# Patient Record
Sex: Female | Born: 1935 | Race: White | Hispanic: No | Marital: Married | Smoking: Never smoker
Health system: Southern US, Community
[De-identification: ages and names within clinical notes are randomized; demographics above are authoritative.]

## PROBLEM LIST (undated history)

## (undated) DIAGNOSIS — R011 Cardiac murmur, unspecified: Secondary | ICD-10-CM

## (undated) DIAGNOSIS — K219 Gastro-esophageal reflux disease without esophagitis: Secondary | ICD-10-CM

## (undated) DIAGNOSIS — R51 Headache: Secondary | ICD-10-CM

## (undated) DIAGNOSIS — R519 Headache, unspecified: Secondary | ICD-10-CM

## (undated) HISTORY — PX: EYE SURGERY: SHX253

## (undated) HISTORY — PX: ABDOMINAL HYSTERECTOMY: SHX81

---

## 1998-08-01 ENCOUNTER — Other Ambulatory Visit: Admission: RE | Admit: 1998-08-01 | Discharge: 1998-08-01 | Payer: Self-pay | Admitting: Gynecology

## 1999-08-06 ENCOUNTER — Other Ambulatory Visit: Admission: RE | Admit: 1999-08-06 | Discharge: 1999-08-06 | Payer: Self-pay | Admitting: Gynecology

## 1999-08-21 ENCOUNTER — Encounter: Admission: RE | Admit: 1999-08-21 | Discharge: 1999-11-19 | Payer: Self-pay | Admitting: Family Medicine

## 2000-08-11 ENCOUNTER — Other Ambulatory Visit: Admission: RE | Admit: 2000-08-11 | Discharge: 2000-08-11 | Payer: Self-pay | Admitting: Gynecology

## 2001-08-30 ENCOUNTER — Other Ambulatory Visit: Admission: RE | Admit: 2001-08-30 | Discharge: 2001-08-30 | Payer: Self-pay | Admitting: Gynecology

## 2002-08-23 ENCOUNTER — Encounter: Payer: Self-pay | Admitting: Family Medicine

## 2002-08-23 ENCOUNTER — Encounter: Admission: RE | Admit: 2002-08-23 | Discharge: 2002-08-23 | Payer: Self-pay | Admitting: Family Medicine

## 2002-09-05 ENCOUNTER — Other Ambulatory Visit: Admission: RE | Admit: 2002-09-05 | Discharge: 2002-09-05 | Payer: Self-pay | Admitting: Gynecology

## 2003-09-12 ENCOUNTER — Other Ambulatory Visit: Admission: RE | Admit: 2003-09-12 | Discharge: 2003-09-12 | Payer: Self-pay | Admitting: Gynecology

## 2004-09-09 ENCOUNTER — Other Ambulatory Visit: Admission: RE | Admit: 2004-09-09 | Discharge: 2004-09-09 | Payer: Self-pay | Admitting: Gynecology

## 2005-09-24 ENCOUNTER — Other Ambulatory Visit: Admission: RE | Admit: 2005-09-24 | Discharge: 2005-09-24 | Payer: Self-pay | Admitting: Gynecology

## 2005-12-01 ENCOUNTER — Encounter: Admission: RE | Admit: 2005-12-01 | Discharge: 2005-12-01 | Payer: Self-pay | Admitting: Family Medicine

## 2006-09-30 ENCOUNTER — Other Ambulatory Visit: Admission: RE | Admit: 2006-09-30 | Discharge: 2006-09-30 | Payer: Self-pay | Admitting: Gynecology

## 2006-12-15 ENCOUNTER — Encounter: Admission: RE | Admit: 2006-12-15 | Discharge: 2006-12-15 | Payer: Self-pay | Admitting: Family Medicine

## 2006-12-24 ENCOUNTER — Encounter: Admission: RE | Admit: 2006-12-24 | Discharge: 2006-12-24 | Payer: Self-pay | Admitting: Family Medicine

## 2007-10-04 ENCOUNTER — Other Ambulatory Visit: Admission: RE | Admit: 2007-10-04 | Discharge: 2007-10-04 | Payer: Self-pay | Admitting: Gynecology

## 2008-02-07 ENCOUNTER — Encounter: Admission: RE | Admit: 2008-02-07 | Discharge: 2008-02-07 | Payer: Self-pay | Admitting: Family Medicine

## 2008-02-10 ENCOUNTER — Encounter: Admission: RE | Admit: 2008-02-10 | Discharge: 2008-02-10 | Payer: Self-pay | Admitting: Family Medicine

## 2008-03-17 ENCOUNTER — Encounter: Admission: RE | Admit: 2008-03-17 | Discharge: 2008-04-19 | Payer: Self-pay | Admitting: Orthopedic Surgery

## 2008-10-23 ENCOUNTER — Emergency Department (HOSPITAL_BASED_OUTPATIENT_CLINIC_OR_DEPARTMENT_OTHER): Admission: EM | Admit: 2008-10-23 | Discharge: 2008-10-23 | Payer: Self-pay | Admitting: Emergency Medicine

## 2008-10-23 ENCOUNTER — Ambulatory Visit: Payer: Self-pay | Admitting: Radiology

## 2009-04-27 ENCOUNTER — Encounter: Admission: RE | Admit: 2009-04-27 | Discharge: 2009-04-27 | Payer: Self-pay | Admitting: Family Medicine

## 2009-10-12 ENCOUNTER — Other Ambulatory Visit: Admission: RE | Admit: 2009-10-12 | Discharge: 2009-10-12 | Payer: Self-pay | Admitting: Family Medicine

## 2009-11-15 IMAGING — CR DG CHEST 2V
2 series · 2 of 2 positions shown · non-contrast
Comparison: 12/15/2006.

CLINICAL DATA: Left-sided pain.  Some shortness breath.

CHEST - 2 VIEW

[w chest pa]
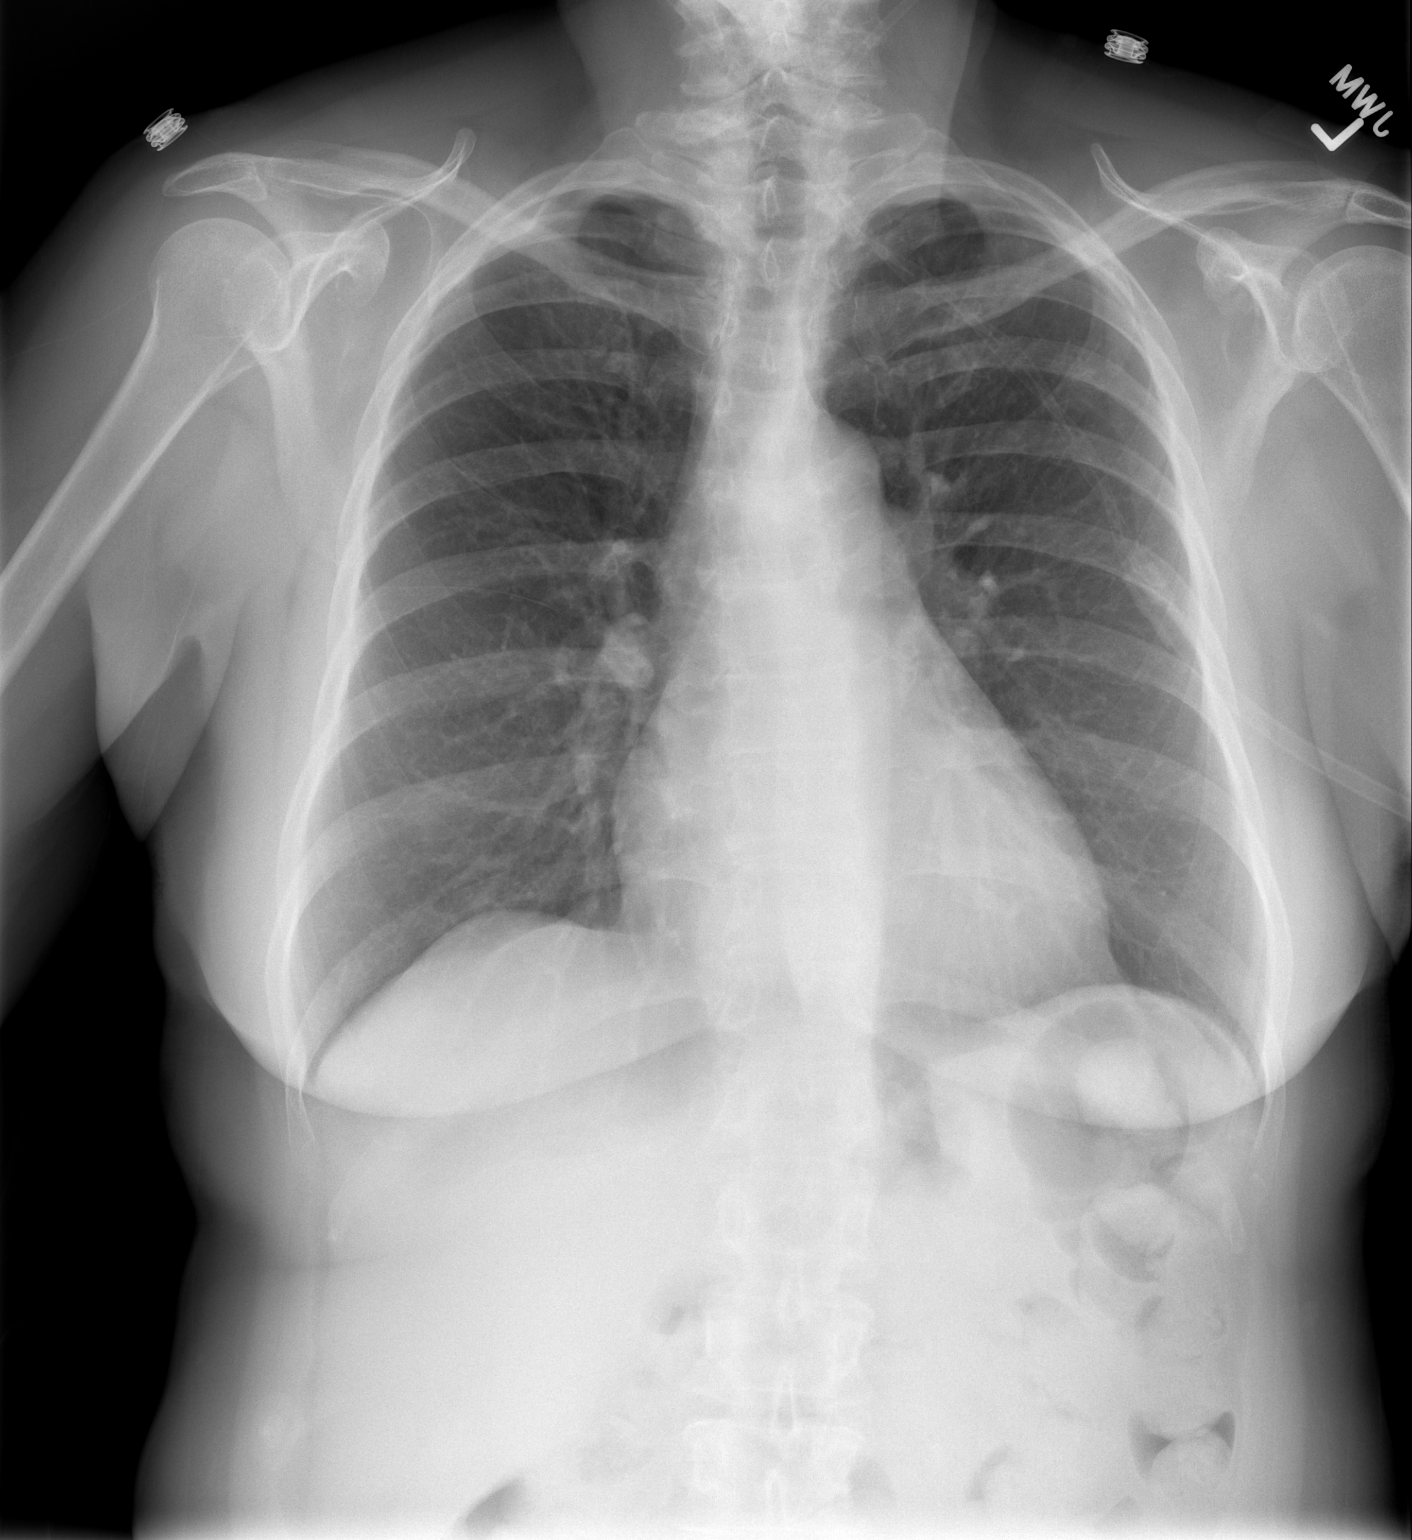

[w chest lat]
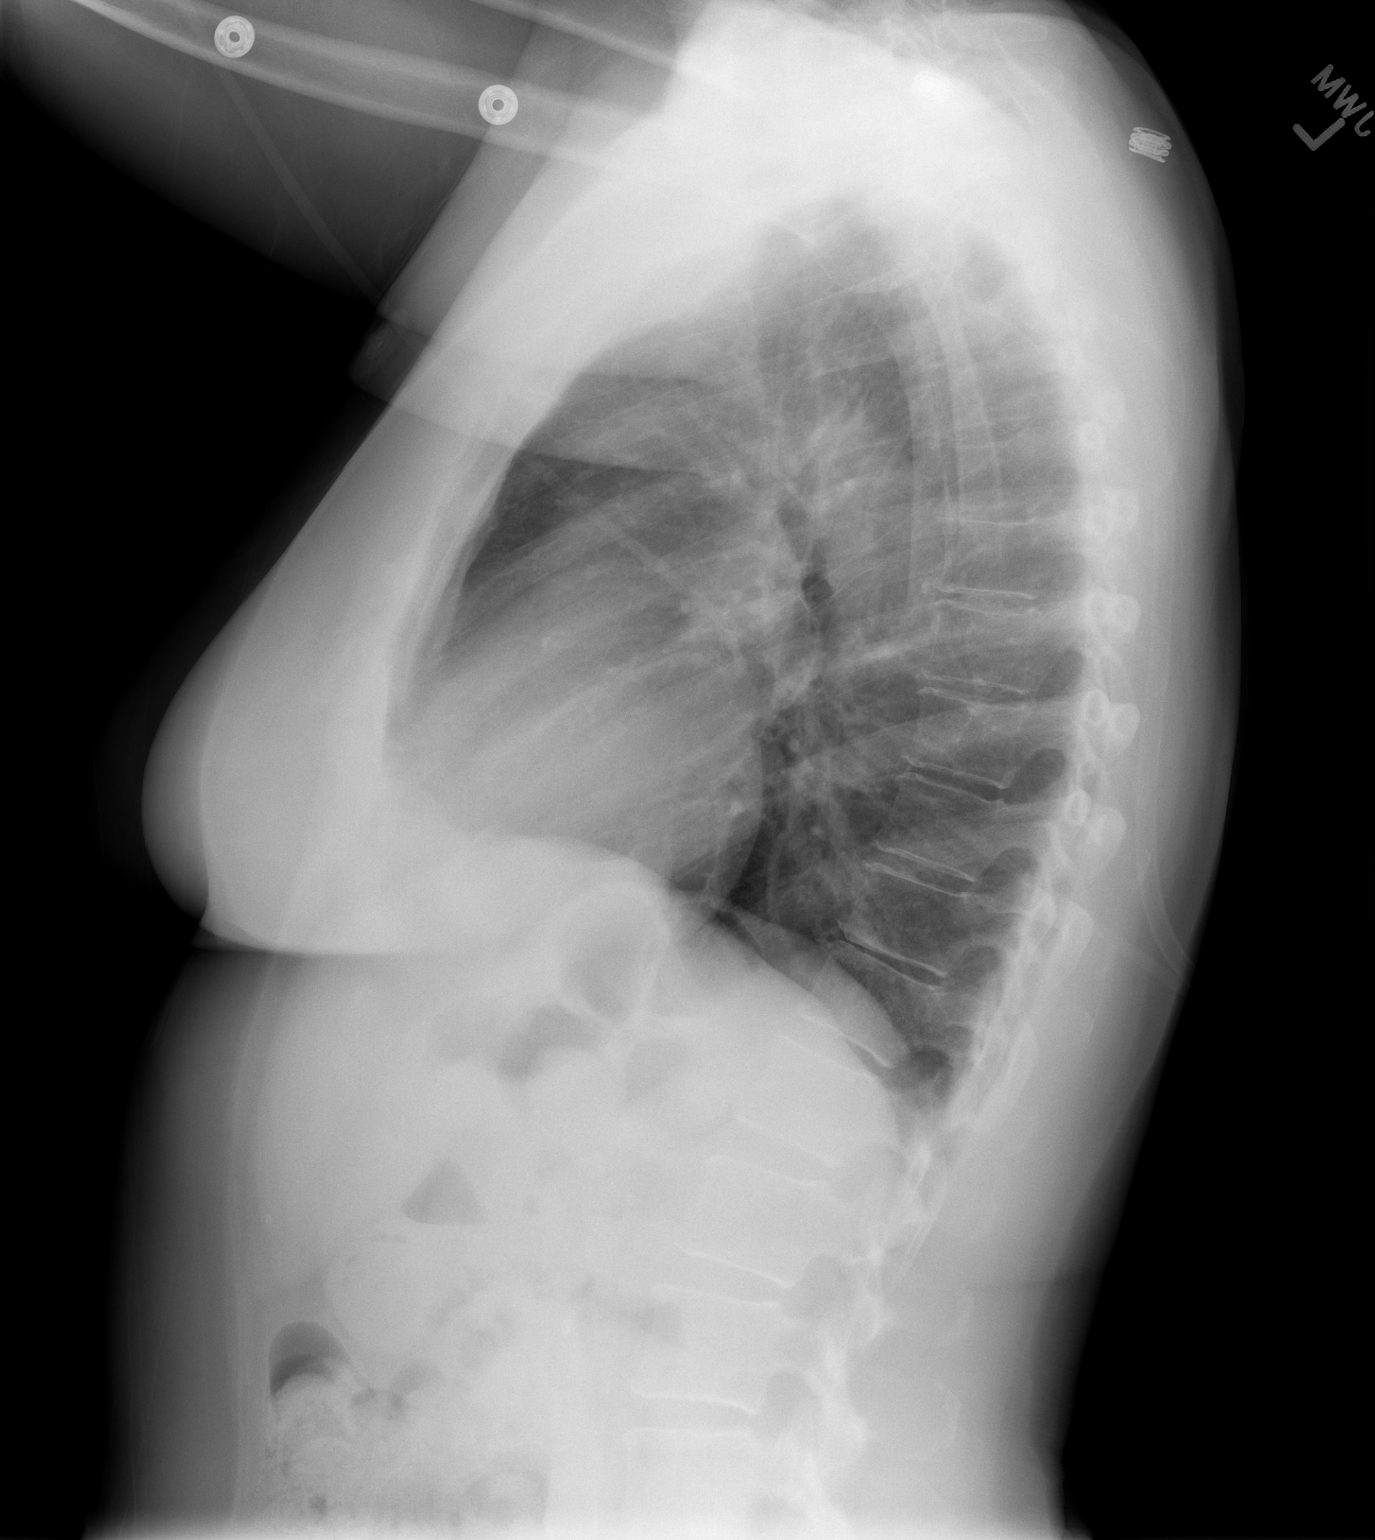

[2 of 2 positions shown; findings below may reference images not displayed]

FINDINGS: No infiltrate, congestive heart failure or pneumothorax.
Heart size is slightly more prominent than on the prior exam but
still within range normal limits.  Minimal peribronchial thickening
unchanged.

Mild degenerative changes/dextroscoliosis mid to upper thoracic
spine.
IMPRESSION: No infiltrate or congestive heart failure.

Minimal peribronchial thickening unchanged.

Heart appears slightly larger than on the prior exam but still the
range normal limits.

## 2010-06-04 ENCOUNTER — Encounter: Admission: RE | Admit: 2010-06-04 | Discharge: 2010-07-02 | Payer: Self-pay | Admitting: Family Medicine

## 2010-11-17 ENCOUNTER — Encounter: Payer: Self-pay | Admitting: Family Medicine

## 2011-08-01 LAB — DIFFERENTIAL
Basophils Absolute: 0 10*3/uL (ref 0.0–0.1)
Basophils Relative: 0 % (ref 0–1)
Monocytes Relative: 7 % (ref 3–12)
Neutro Abs: 5.4 10*3/uL (ref 1.7–7.7)
Neutrophils Relative %: 59 % (ref 43–77)

## 2011-08-01 LAB — BASIC METABOLIC PANEL
BUN: 13 mg/dL (ref 6–23)
CO2: 27 mEq/L (ref 19–32)
Calcium: 9.5 mg/dL (ref 8.4–10.5)
Creatinine, Ser: 0.6 mg/dL (ref 0.4–1.2)
GFR calc Af Amer: >60 mL/min (ref >60)

## 2011-08-01 LAB — POCT CARDIAC MARKERS
CKMB, poc: 2.1 ng/mL (ref 1.0–8.0)
Myoglobin, poc: 125 ng/mL (ref 12–200)
Myoglobin, poc: 82.7 ng/mL (ref 12–200)

## 2011-08-01 LAB — CBC
MCHC: 33.9 g/dL (ref 30.0–36.0)
Platelets: 373 10*3/uL (ref 150–400)
RBC: 4.04 MIL/uL (ref 3.87–5.11)
WBC: 9.3 10*3/uL (ref 4.0–10.5)

## 2011-08-21 ENCOUNTER — Ambulatory Visit: Payer: Medicare Other | Attending: Family Medicine | Admitting: Physical Therapy

## 2011-08-21 DIAGNOSIS — R262 Difficulty in walking, not elsewhere classified: Secondary | ICD-10-CM | POA: Insufficient documentation

## 2011-08-21 DIAGNOSIS — IMO0001 Reserved for inherently not codable concepts without codable children: Secondary | ICD-10-CM | POA: Insufficient documentation

## 2011-08-21 DIAGNOSIS — M6281 Muscle weakness (generalized): Secondary | ICD-10-CM | POA: Insufficient documentation

## 2011-08-21 DIAGNOSIS — M25559 Pain in unspecified hip: Secondary | ICD-10-CM | POA: Insufficient documentation

## 2011-08-22 ENCOUNTER — Ambulatory Visit: Payer: Medicare Other | Admitting: Physical Therapy

## 2011-08-28 ENCOUNTER — Ambulatory Visit: Payer: Medicare Other | Attending: Family Medicine | Admitting: Physical Therapy

## 2011-08-28 DIAGNOSIS — M25559 Pain in unspecified hip: Secondary | ICD-10-CM | POA: Insufficient documentation

## 2011-08-28 DIAGNOSIS — R262 Difficulty in walking, not elsewhere classified: Secondary | ICD-10-CM | POA: Insufficient documentation

## 2011-08-28 DIAGNOSIS — IMO0001 Reserved for inherently not codable concepts without codable children: Secondary | ICD-10-CM | POA: Insufficient documentation

## 2011-08-28 DIAGNOSIS — M6281 Muscle weakness (generalized): Secondary | ICD-10-CM | POA: Insufficient documentation

## 2011-09-02 ENCOUNTER — Ambulatory Visit: Payer: Medicare Other | Admitting: Physical Therapy

## 2011-09-05 ENCOUNTER — Encounter: Payer: Medicare Other | Admitting: Physical Therapy

## 2012-03-01 ENCOUNTER — Other Ambulatory Visit: Payer: Self-pay | Admitting: Family Medicine

## 2012-03-01 DIAGNOSIS — R1013 Epigastric pain: Secondary | ICD-10-CM

## 2012-03-02 ENCOUNTER — Ambulatory Visit
Admission: RE | Admit: 2012-03-02 | Discharge: 2012-03-02 | Disposition: A | Discharge status: Home / Self Care | Payer: Medicare Other | Source: Ambulatory Visit | Attending: Family Medicine | Admitting: Family Medicine

## 2012-03-02 DIAGNOSIS — R1013 Epigastric pain: Secondary | ICD-10-CM

## 2015-04-26 ENCOUNTER — Ambulatory Visit
Admission: RE | Admit: 2015-04-26 | Discharge: 2015-04-26 | Disposition: A | Discharge status: Home / Self Care | Payer: Medicare Other | Source: Ambulatory Visit | Attending: Family Medicine | Admitting: Family Medicine

## 2015-04-26 ENCOUNTER — Other Ambulatory Visit: Payer: Self-pay | Admitting: Family Medicine

## 2015-04-26 DIAGNOSIS — M542 Cervicalgia: Secondary | ICD-10-CM

## 2015-08-06 ENCOUNTER — Ambulatory Visit
Admission: RE | Admit: 2015-08-06 | Discharge: 2015-08-06 | Disposition: A | Discharge status: Home / Self Care | Payer: Medicare Other | Source: Ambulatory Visit | Attending: Physician Assistant | Admitting: Physician Assistant

## 2015-08-06 ENCOUNTER — Other Ambulatory Visit: Payer: Self-pay | Admitting: Physician Assistant

## 2015-08-06 DIAGNOSIS — R52 Pain, unspecified: Secondary | ICD-10-CM

## 2015-10-24 ENCOUNTER — Other Ambulatory Visit: Payer: Self-pay | Admitting: Family Medicine

## 2015-10-24 ENCOUNTER — Ambulatory Visit
Admission: RE | Admit: 2015-10-24 | Discharge: 2015-10-24 | Disposition: A | Discharge status: Home / Self Care | Payer: Medicare Other | Source: Ambulatory Visit | Attending: Family Medicine | Admitting: Family Medicine

## 2015-10-24 DIAGNOSIS — R52 Pain, unspecified: Secondary | ICD-10-CM

## 2015-10-31 ENCOUNTER — Other Ambulatory Visit: Payer: Self-pay | Admitting: Family Medicine

## 2015-10-31 DIAGNOSIS — R1013 Epigastric pain: Secondary | ICD-10-CM

## 2015-11-01 ENCOUNTER — Ambulatory Visit
Admission: RE | Admit: 2015-11-01 | Discharge: 2015-11-01 | Disposition: A | Discharge status: Home / Self Care | Payer: Medicare Other | Source: Ambulatory Visit | Attending: Family Medicine | Admitting: Family Medicine

## 2015-11-01 DIAGNOSIS — R1013 Epigastric pain: Secondary | ICD-10-CM

## 2015-11-01 MED ORDER — IOPAMIDOL (ISOVUE-300) INJECTION 61%
100.0000 mL | Freq: Once | INTRAVENOUS | Status: AC | PRN
  Administered 2015-11-01: 100 mL via INTRAVENOUS

## 2015-11-22 ENCOUNTER — Other Ambulatory Visit: Payer: Self-pay | Admitting: Gastroenterology

## 2015-12-03 ENCOUNTER — Encounter (HOSPITAL_COMMUNITY): Payer: Self-pay | Admitting: *Deleted

## 2015-12-04 ENCOUNTER — Encounter (HOSPITAL_COMMUNITY)
Admission: RE | Disposition: A | Discharge status: Home / Self Care | Payer: Self-pay | Source: Ambulatory Visit | Attending: Gastroenterology

## 2015-12-04 ENCOUNTER — Ambulatory Visit (HOSPITAL_COMMUNITY): Payer: Medicare Other | Admitting: Anesthesiology

## 2015-12-04 ENCOUNTER — Encounter (HOSPITAL_COMMUNITY): Payer: Self-pay

## 2015-12-04 ENCOUNTER — Ambulatory Visit (HOSPITAL_COMMUNITY)
Admission: RE | Admit: 2015-12-04 | Discharge: 2015-12-04 | Disposition: A | Discharge status: Home / Self Care | Payer: Medicare Other | Source: Ambulatory Visit | Attending: Gastroenterology | Admitting: Gastroenterology

## 2015-12-04 DIAGNOSIS — E7800 Pure hypercholesterolemia, unspecified: Secondary | ICD-10-CM | POA: Insufficient documentation

## 2015-12-04 DIAGNOSIS — K449 Diaphragmatic hernia without obstruction or gangrene: Secondary | ICD-10-CM | POA: Diagnosis not present

## 2015-12-04 DIAGNOSIS — D1779 Benign lipomatous neoplasm of other sites: Secondary | ICD-10-CM | POA: Diagnosis not present

## 2015-12-04 DIAGNOSIS — I341 Nonrheumatic mitral (valve) prolapse: Secondary | ICD-10-CM | POA: Insufficient documentation

## 2015-12-04 DIAGNOSIS — K219 Gastro-esophageal reflux disease without esophagitis: Secondary | ICD-10-CM | POA: Insufficient documentation

## 2015-12-04 DIAGNOSIS — R195 Other fecal abnormalities: Secondary | ICD-10-CM | POA: Insufficient documentation

## 2015-12-04 DIAGNOSIS — K760 Fatty (change of) liver, not elsewhere classified: Secondary | ICD-10-CM | POA: Diagnosis not present

## 2015-12-04 DIAGNOSIS — Z800 Family history of malignant neoplasm of digestive organs: Secondary | ICD-10-CM | POA: Diagnosis not present

## 2015-12-04 DIAGNOSIS — K5730 Diverticulosis of large intestine without perforation or abscess without bleeding: Secondary | ICD-10-CM | POA: Diagnosis not present

## 2015-12-04 HISTORY — PX: ESOPHAGOGASTRODUODENOSCOPY (EGD) WITH PROPOFOL: SHX5813

## 2015-12-04 HISTORY — DX: Gastro-esophageal reflux disease without esophagitis: K21.9

## 2015-12-04 HISTORY — PX: COLONOSCOPY WITH PROPOFOL: SHX5780

## 2015-12-04 HISTORY — DX: Headache: R51

## 2015-12-04 HISTORY — DX: Cardiac murmur, unspecified: R01.1

## 2015-12-04 HISTORY — DX: Headache, unspecified: R51.9

## 2015-12-04 SURGERY — COLONOSCOPY WITH PROPOFOL
Anesthesia: Monitor Anesthesia Care

## 2015-12-04 MED ORDER — PROPOFOL 500 MG/50ML IV EMUL
INTRAVENOUS | Status: DC | PRN
  Administered 2015-12-04: 100 ug/kg/min via INTRAVENOUS

## 2015-12-04 MED ORDER — PROPOFOL 500 MG/50ML IV EMUL
INTRAVENOUS | Status: DC | PRN
  Administered 2015-12-04: 20 mg via INTRAVENOUS
  Administered 2015-12-04: 10 mg via INTRAVENOUS
  Administered 2015-12-04 (×2): 20 mg via INTRAVENOUS
  Administered 2015-12-04: 40 mg via INTRAVENOUS

## 2015-12-04 MED ORDER — PROPOFOL 10 MG/ML IV BOLUS
INTRAVENOUS | Status: AC
  Filled 2015-12-04: qty 40

## 2015-12-04 MED ORDER — LACTATED RINGERS IV SOLN
INTRAVENOUS | Status: DC
  Administered 2015-12-04: 1000 mL via INTRAVENOUS

## 2015-12-04 MED ORDER — SODIUM CHLORIDE 0.9 % IV SOLN: INTRAVENOUS | Status: DC

## 2015-12-04 MED ORDER — PROPOFOL 10 MG/ML IV BOLUS
INTRAVENOUS | Status: AC
  Filled 2015-12-04: qty 20

## 2015-12-04 SURGICAL SUPPLY — 24 items

## 2015-12-04 NOTE — Discharge Instructions (Signed)
Colonoscopy, Care After °These instructions give you information on caring for yourself after your procedure. Your doctor may also give you more specific instructions. Call your doctor if you have any problems or questions after your procedure. °HOME CARE °· Do not drive for 24 hours. °· Do not sign important papers or use machinery for 24 hours. °· You may shower. °· You may go back to your usual activities, but go slower for the first 24 hours. °· Take rest breaks often during the first 24 hours. °· Walk around or use warm packs on your belly (abdomen) if you have belly cramping or gas. °· Drink enough fluids to keep your pee (urine) clear or pale yellow. °· Resume your normal diet. Avoid heavy or fried foods. °· Avoid drinking alcohol for 24 hours or as told by your doctor. °· Only take medicines as told by your doctor. °If a tissue sample (biopsy) was taken during the procedure:  °· Do not take aspirin or blood thinners for 7 days, or as told by your doctor. °· Do not drink alcohol for 7 days, or as told by your doctor. °· Eat soft foods for the first 24 hours. °GET HELP IF: °You still have a small amount of blood in your poop (stool) 2-3 days after the procedure. °GET HELP RIGHT AWAY IF: °· You have more than a small amount of blood in your poop. °· You see clumps of tissue (blood clots) in your poop. °· Your belly is puffy (swollen). °· You feel sick to your stomach (nauseous) or throw up (vomit). °· You have a fever. °· You have belly pain that gets worse and medicine does not help. °MAKE SURE YOU: °· Understand these instructions. °· Will watch your condition. °· Will get help right away if you are not doing well or get worse. °  °This information is not intended to replace advice given to you by your health care provider. Make sure you discuss any questions you have with your health care provider. °  °Document Released: 11/15/2010 Document Revised: 10/18/2013 Document Reviewed: 06/20/2013 °Elsevier  Interactive Patient Education ©2016 [employer] °Esophagogastroduodenoscopy, Care After °Refer to this sheet in the next few weeks. These instructions provide you with information about caring for yourself after your procedure. Your health care provider may also give you more specific instructions. Your treatment has been planned according to current medical practices, but problems sometimes occur. Call your health care provider if you have any problems or questions after your procedure. °WHAT TO EXPECT AFTER THE PROCEDURE °After your procedure, it is typical to feel: °· Soreness in your throat. °· Pain with swallowing. °· Sick to your stomach (nauseous). °· Bloated. °· Dizzy. °· Fatigued. °HOME CARE INSTRUCTIONS °· Do not eat or drink anything until the numbing medicine (local anesthetic) has worn off and your gag reflex has returned. You will know that the local anesthetic has worn off when you can swallow comfortably. °· Do not drive or operate machinery until directed by your health care provider. °· Take medicines only as directed by your health care provider. °SEEK MEDICAL CARE IF:  °· You cannot stop coughing. °· You are not urinating at all or less than usual. °SEEK IMMEDIATE MEDICAL CARE IF: °· You have difficulty swallowing. °· You cannot eat or drink. °· You have worsening throat or chest pain. °· You have dizziness or lightheadedness or you faint. °· You have nausea or vomiting. °· You have chills. °· You have a fever. °·   You have severe abdominal pain. °· You have black, tarry, or bloody stools. °  °This information is not intended to replace advice given to you by your health care provider. Make sure you discuss any questions you have with your health care provider. °  °Document Released: 09/29/2012 Document Revised: 11/03/2014 Document Reviewed: 09/29/2012 °Elsevier Interactive Patient Education ©2016 [employer] ° °

## 2015-12-04 NOTE — Op Note (Signed)
Problems: Melenic-appearing stool developed on meloxicam and Pepto-Bismol. [name] diagnosed with colon cancer under age 81. Normal screening colonoscopies performed in 2007 and 2012  Endoscopist: Earle Gell  Premedication: Propofol administered by anesthesia  Procedure: Diagnostic esophagogastroduodenoscopy. The patient was placed in the left lateral decubitus position. The Pentax gastroscope was passed through the posterior hypopharynx into the proximal esophagus without difficulty. The hypopharynx, larynx, and vocal cords appeared normal.  Esophagoscopy: The proximal, mid, and lower segments of the esophageal mucosa appeared normal. The squamocolumnar junction is regular in appearance and noted at 35 cm from the incisor teeth.  Gastroscopy: Retroflexed view of the gastric cardia and fundus was normal. There was a small hiatal hernia. The gastric body, antrum, and pylorus appeared normal.  Duodenoscopy: The duodenal bulb and descending duodenum appeared normal.  Assessment: Normal esophagogastroduodenoscopy  Procedure: Screening colonoscopy. Anal inspection and digital rectal exam were normal. The Pentax pediatric colonoscope was introduced into the rectum and advanced to the cecum. A normal-appearing ileocecal valve and appendiceal orifice were identified. Colonic preparation for the exam today was good. Withdrawal time was 14 minutes.  Rectum. Normal. Retroflex view of the distal rectum was normal  Sigmoid colon and descending colon. Colonic diverticulosis. From the mid sigmoid colon, a 7 mm pedunculated polyp was removed with the electrocautery snare. An Endo Clip was applied to the pedunculated polyp base prior to performing polypectomy.  Splenic flexure. Normal  Transverse colon. Normal  Hepatic flexure. Normal  Ascending colon. Normal  Cecum and ileocecal valve. Normal  Assessment: A small pedunculated polyp was removed from the sigmoid colon with the electrocautery snare.  Otherwise normal colonoscopy.

## 2015-12-04 NOTE — Anesthesia Preprocedure Evaluation (Addendum)
Anesthesia Evaluation  Patient identified by MRN, date of birth, ID band Patient awake    Reviewed: Allergy & Precautions, NPO status , Patient's Chart, lab work & pertinent test results  Airway Mallampati: I  TM Distance: >3 FB     Dental   Pulmonary    Pulmonary exam normal        Cardiovascular Normal cardiovascular exam     Neuro/Psych  Headaches,    GI/Hepatic GERD  ,  Endo/Other    Renal/GU      Musculoskeletal   Abdominal   Peds  Hematology   Anesthesia Other Findings   Reproductive/Obstetrics                             Anesthesia Physical Anesthesia Plan  ASA: I  Anesthesia Plan: MAC and General   Post-op Pain Management:    Induction: Intravenous  Airway Management Planned: Mask  Additional Equipment:   Intra-op Plan:   Post-operative Plan:   Informed Consent: I have reviewed the patients History and Physical, chart, labs and discussed the procedure including the risks, benefits and alternatives for the proposed anesthesia with the patient or authorized representative who has indicated his/her understanding and acceptance.     Plan Discussed with: CRNA, Anesthesiologist and Surgeon  Anesthesia Plan Comments:         Anesthesia Quick Evaluation

## 2015-12-04 NOTE — Transfer of Care (Signed)
Immediate Anesthesia Transfer of Care Note  Patient: Taylor Jacobs  Procedure(s) Performed: Procedure(s): COLONOSCOPY WITH PROPOFOL (N/A) ESOPHAGOGASTRODUODENOSCOPY (EGD) WITH PROPOFOL (N/A)  Patient Location: PACU  Anesthesia Type:MAC  Level of Consciousness: awake, alert  and oriented  Airway & Oxygen Therapy: Patient Spontanous Breathing and Patient connected to nasal cannula oxygen  Post-op Assessment: Report given to RN and Post -op Vital signs reviewed and stable  Post vital signs: Reviewed and stable  Last Vitals:  Filed Vitals:   12/04/15 0631  BP: 137/68  Pulse: 66  Temp: 36.6 C  Resp: 15    Complications: No apparent anesthesia complications

## 2015-12-04 NOTE — H&P (Signed)
  Problems: Melenic-appearing stool which developed on meloxicam. [name] diagnosed with colon cancer under age 81. Normal screening colonoscopies performed in 2007 and 2012. 10/31/2015 CBC and complete metabolic profile were normal. 10/31/2015 CT scan of the abdomen and pelvis showed a fatty appearing liver.  History: The patient is a 81 year old female born May 03, 1935. Her sister was diagnosed with colon cancer under age 78. The patient underwent a normal screening colonoscopies in 2007 and 2012.  The patient was prescribed meloxicam to treat rib pain. She developed epigastric pain on meloxicam and took Pepto-Bismol with Tums. Her stool turned dark. She was prescribed sucralfate but took only one tablet which caused severe burning retrosternal and epigastric pain.  She is taking Tylenol for pain and a proton pump inhibitor for epigastric pain. There is no past history of peptic ulcer disease. Her melenic-appearing stool has resolved.  The patient is scheduled to undergo diagnostic esophagogastroduodenoscopy and repeat screening colonoscopy today.  Past medical history: Hypercholesterolemia. Mitral valve prolapse syndrome. Gastroesophageal reflux with hiatal hernia. Hysterectomy.  Medication allergies: Sulfa drugs. Fosamax. Crestor.  Exam: The patient is alert and lying comfortably on the endoscopy stretcher. Abdomen is soft and nontender to palpation. Lungs are clear to auscultation. Cardiac exam reveals a regular rhythm.  Plan: Proceed with diagnostic esophagogastroduodenoscopy and screening colonoscopy

## 2015-12-04 NOTE — Anesthesia Postprocedure Evaluation (Signed)
Anesthesia Post Note  Patient: Taylor Jacobs  Procedure(s) Performed: Procedure(s) (LRB): COLONOSCOPY WITH PROPOFOL (N/A) ESOPHAGOGASTRODUODENOSCOPY (EGD) WITH PROPOFOL (N/A)  Patient location during evaluation: Endoscopy Anesthesia Type: MAC Level of consciousness: awake, awake and alert, oriented and patient cooperative Pain management: pain level controlled Vital Signs Assessment: post-procedure vital signs reviewed and stable Respiratory status: spontaneous breathing and respiratory function stable Cardiovascular status: blood pressure returned to baseline and stable Anesthetic complications: no    Last Vitals:  Filed Vitals:   12/04/15 0631 12/04/15 0841  BP: 137/68   Pulse: 66 62  Temp: 36.6 C 36.6 C  Resp: 15 16    Last Pain: There were no vitals filed for this visit.               [name]

## 2015-12-05 ENCOUNTER — Encounter (HOSPITAL_COMMUNITY): Payer: Self-pay | Admitting: Gastroenterology

## 2016-11-19 DIAGNOSIS — M9902 Segmental and somatic dysfunction of thoracic region: Secondary | ICD-10-CM | POA: Diagnosis not present

## 2016-11-19 DIAGNOSIS — M531 Cervicobrachial syndrome: Secondary | ICD-10-CM | POA: Diagnosis not present

## 2016-11-19 DIAGNOSIS — M9901 Segmental and somatic dysfunction of cervical region: Secondary | ICD-10-CM | POA: Diagnosis not present

## 2016-11-19 DIAGNOSIS — M5414 Radiculopathy, thoracic region: Secondary | ICD-10-CM | POA: Diagnosis not present

## 2016-11-25 DIAGNOSIS — M9902 Segmental and somatic dysfunction of thoracic region: Secondary | ICD-10-CM | POA: Diagnosis not present

## 2016-11-25 DIAGNOSIS — M9901 Segmental and somatic dysfunction of cervical region: Secondary | ICD-10-CM | POA: Diagnosis not present

## 2016-11-25 DIAGNOSIS — M5414 Radiculopathy, thoracic region: Secondary | ICD-10-CM | POA: Diagnosis not present

## 2016-11-25 DIAGNOSIS — M531 Cervicobrachial syndrome: Secondary | ICD-10-CM | POA: Diagnosis not present

## 2017-01-12 DIAGNOSIS — M531 Cervicobrachial syndrome: Secondary | ICD-10-CM | POA: Diagnosis not present

## 2017-01-12 DIAGNOSIS — M5414 Radiculopathy, thoracic region: Secondary | ICD-10-CM | POA: Diagnosis not present

## 2017-01-12 DIAGNOSIS — M9902 Segmental and somatic dysfunction of thoracic region: Secondary | ICD-10-CM | POA: Diagnosis not present

## 2017-01-12 DIAGNOSIS — M9901 Segmental and somatic dysfunction of cervical region: Secondary | ICD-10-CM | POA: Diagnosis not present

## 2017-01-15 DIAGNOSIS — M9902 Segmental and somatic dysfunction of thoracic region: Secondary | ICD-10-CM | POA: Diagnosis not present

## 2017-01-15 DIAGNOSIS — M531 Cervicobrachial syndrome: Secondary | ICD-10-CM | POA: Diagnosis not present

## 2017-01-15 DIAGNOSIS — M9901 Segmental and somatic dysfunction of cervical region: Secondary | ICD-10-CM | POA: Diagnosis not present

## 2017-01-15 DIAGNOSIS — M5414 Radiculopathy, thoracic region: Secondary | ICD-10-CM | POA: Diagnosis not present

## 2017-01-19 DIAGNOSIS — M5414 Radiculopathy, thoracic region: Secondary | ICD-10-CM | POA: Diagnosis not present

## 2017-01-19 DIAGNOSIS — M9902 Segmental and somatic dysfunction of thoracic region: Secondary | ICD-10-CM | POA: Diagnosis not present

## 2017-01-19 DIAGNOSIS — M531 Cervicobrachial syndrome: Secondary | ICD-10-CM | POA: Diagnosis not present

## 2017-01-19 DIAGNOSIS — M9901 Segmental and somatic dysfunction of cervical region: Secondary | ICD-10-CM | POA: Diagnosis not present

## 2017-01-21 DIAGNOSIS — M9901 Segmental and somatic dysfunction of cervical region: Secondary | ICD-10-CM | POA: Diagnosis not present

## 2017-01-21 DIAGNOSIS — M9902 Segmental and somatic dysfunction of thoracic region: Secondary | ICD-10-CM | POA: Diagnosis not present

## 2017-01-21 DIAGNOSIS — M5414 Radiculopathy, thoracic region: Secondary | ICD-10-CM | POA: Diagnosis not present

## 2017-01-21 DIAGNOSIS — M531 Cervicobrachial syndrome: Secondary | ICD-10-CM | POA: Diagnosis not present

## 2017-02-05 DIAGNOSIS — M9902 Segmental and somatic dysfunction of thoracic region: Secondary | ICD-10-CM | POA: Diagnosis not present

## 2017-02-05 DIAGNOSIS — M9901 Segmental and somatic dysfunction of cervical region: Secondary | ICD-10-CM | POA: Diagnosis not present

## 2017-02-05 DIAGNOSIS — M5414 Radiculopathy, thoracic region: Secondary | ICD-10-CM | POA: Diagnosis not present

## 2017-02-05 DIAGNOSIS — M531 Cervicobrachial syndrome: Secondary | ICD-10-CM | POA: Diagnosis not present

## 2017-03-04 DIAGNOSIS — M9901 Segmental and somatic dysfunction of cervical region: Secondary | ICD-10-CM | POA: Diagnosis not present

## 2017-03-04 DIAGNOSIS — M9902 Segmental and somatic dysfunction of thoracic region: Secondary | ICD-10-CM | POA: Diagnosis not present

## 2017-03-04 DIAGNOSIS — M5414 Radiculopathy, thoracic region: Secondary | ICD-10-CM | POA: Diagnosis not present

## 2017-03-04 DIAGNOSIS — M531 Cervicobrachial syndrome: Secondary | ICD-10-CM | POA: Diagnosis not present

## 2017-03-10 DIAGNOSIS — M9902 Segmental and somatic dysfunction of thoracic region: Secondary | ICD-10-CM | POA: Diagnosis not present

## 2017-03-10 DIAGNOSIS — M9901 Segmental and somatic dysfunction of cervical region: Secondary | ICD-10-CM | POA: Diagnosis not present

## 2017-03-10 DIAGNOSIS — M5414 Radiculopathy, thoracic region: Secondary | ICD-10-CM | POA: Diagnosis not present

## 2017-03-10 DIAGNOSIS — M531 Cervicobrachial syndrome: Secondary | ICD-10-CM | POA: Diagnosis not present

## 2017-05-12 DIAGNOSIS — M9901 Segmental and somatic dysfunction of cervical region: Secondary | ICD-10-CM | POA: Diagnosis not present

## 2017-05-12 DIAGNOSIS — M531 Cervicobrachial syndrome: Secondary | ICD-10-CM | POA: Diagnosis not present

## 2017-05-12 DIAGNOSIS — M5414 Radiculopathy, thoracic region: Secondary | ICD-10-CM | POA: Diagnosis not present

## 2017-05-12 DIAGNOSIS — M9902 Segmental and somatic dysfunction of thoracic region: Secondary | ICD-10-CM | POA: Diagnosis not present

## 2017-05-18 DIAGNOSIS — M9902 Segmental and somatic dysfunction of thoracic region: Secondary | ICD-10-CM | POA: Diagnosis not present

## 2017-05-18 DIAGNOSIS — M5414 Radiculopathy, thoracic region: Secondary | ICD-10-CM | POA: Diagnosis not present

## 2017-05-18 DIAGNOSIS — M9901 Segmental and somatic dysfunction of cervical region: Secondary | ICD-10-CM | POA: Diagnosis not present

## 2017-05-18 DIAGNOSIS — M531 Cervicobrachial syndrome: Secondary | ICD-10-CM | POA: Diagnosis not present

## 2017-05-27 DIAGNOSIS — M9901 Segmental and somatic dysfunction of cervical region: Secondary | ICD-10-CM | POA: Diagnosis not present

## 2017-05-27 DIAGNOSIS — M531 Cervicobrachial syndrome: Secondary | ICD-10-CM | POA: Diagnosis not present

## 2017-05-27 DIAGNOSIS — M9902 Segmental and somatic dysfunction of thoracic region: Secondary | ICD-10-CM | POA: Diagnosis not present

## 2017-05-27 DIAGNOSIS — M5414 Radiculopathy, thoracic region: Secondary | ICD-10-CM | POA: Diagnosis not present

## 2017-07-15 DIAGNOSIS — Z23 Encounter for immunization: Secondary | ICD-10-CM | POA: Diagnosis not present

## 2017-07-16 DIAGNOSIS — H5212 Myopia, left eye: Secondary | ICD-10-CM | POA: Diagnosis not present

## 2017-07-16 DIAGNOSIS — H18413 Arcus senilis, bilateral: Secondary | ICD-10-CM | POA: Diagnosis not present

## 2017-07-16 DIAGNOSIS — H5201 Hypermetropia, right eye: Secondary | ICD-10-CM | POA: Diagnosis not present

## 2017-07-16 DIAGNOSIS — Z961 Presence of intraocular lens: Secondary | ICD-10-CM | POA: Diagnosis not present

## 2017-07-16 DIAGNOSIS — H11153 Pinguecula, bilateral: Secondary | ICD-10-CM | POA: Diagnosis not present

## 2017-07-16 DIAGNOSIS — H52223 Regular astigmatism, bilateral: Secondary | ICD-10-CM | POA: Diagnosis not present

## 2017-07-16 DIAGNOSIS — H40013 Open angle with borderline findings, low risk, bilateral: Secondary | ICD-10-CM | POA: Diagnosis not present

## 2017-07-16 DIAGNOSIS — H3589 Other specified retinal disorders: Secondary | ICD-10-CM | POA: Diagnosis not present

## 2017-07-16 DIAGNOSIS — H40003 Preglaucoma, unspecified, bilateral: Secondary | ICD-10-CM | POA: Diagnosis not present

## 2017-07-16 DIAGNOSIS — H04123 Dry eye syndrome of bilateral lacrimal glands: Secondary | ICD-10-CM | POA: Diagnosis not present

## 2017-07-16 DIAGNOSIS — H524 Presbyopia: Secondary | ICD-10-CM | POA: Diagnosis not present

## 2017-07-28 DIAGNOSIS — N3281 Overactive bladder: Secondary | ICD-10-CM | POA: Diagnosis not present

## 2017-07-28 DIAGNOSIS — N393 Stress incontinence (female) (male): Secondary | ICD-10-CM | POA: Diagnosis not present

## 2017-07-28 DIAGNOSIS — R21 Rash and other nonspecific skin eruption: Secondary | ICD-10-CM | POA: Diagnosis not present

## 2017-08-04 DIAGNOSIS — M9902 Segmental and somatic dysfunction of thoracic region: Secondary | ICD-10-CM | POA: Diagnosis not present

## 2017-08-04 DIAGNOSIS — M5416 Radiculopathy, lumbar region: Secondary | ICD-10-CM | POA: Diagnosis not present

## 2017-08-04 DIAGNOSIS — M9903 Segmental and somatic dysfunction of lumbar region: Secondary | ICD-10-CM | POA: Diagnosis not present

## 2017-08-04 DIAGNOSIS — M9901 Segmental and somatic dysfunction of cervical region: Secondary | ICD-10-CM | POA: Diagnosis not present

## 2017-08-04 DIAGNOSIS — M531 Cervicobrachial syndrome: Secondary | ICD-10-CM | POA: Diagnosis not present

## 2017-08-04 DIAGNOSIS — M5414 Radiculopathy, thoracic region: Secondary | ICD-10-CM | POA: Diagnosis not present

## 2017-08-07 DIAGNOSIS — Z1231 Encounter for screening mammogram for malignant neoplasm of breast: Secondary | ICD-10-CM | POA: Diagnosis not present

## 2017-09-02 DIAGNOSIS — M531 Cervicobrachial syndrome: Secondary | ICD-10-CM | POA: Diagnosis not present

## 2017-09-02 DIAGNOSIS — M9902 Segmental and somatic dysfunction of thoracic region: Secondary | ICD-10-CM | POA: Diagnosis not present

## 2017-09-02 DIAGNOSIS — M5414 Radiculopathy, thoracic region: Secondary | ICD-10-CM | POA: Diagnosis not present

## 2017-09-02 DIAGNOSIS — M9901 Segmental and somatic dysfunction of cervical region: Secondary | ICD-10-CM | POA: Diagnosis not present

## 2017-09-02 DIAGNOSIS — M9903 Segmental and somatic dysfunction of lumbar region: Secondary | ICD-10-CM | POA: Diagnosis not present

## 2017-09-02 DIAGNOSIS — M5416 Radiculopathy, lumbar region: Secondary | ICD-10-CM | POA: Diagnosis not present

## 2017-10-07 DIAGNOSIS — M5416 Radiculopathy, lumbar region: Secondary | ICD-10-CM | POA: Diagnosis not present

## 2017-10-07 DIAGNOSIS — M531 Cervicobrachial syndrome: Secondary | ICD-10-CM | POA: Diagnosis not present

## 2017-10-07 DIAGNOSIS — M5414 Radiculopathy, thoracic region: Secondary | ICD-10-CM | POA: Diagnosis not present

## 2017-10-07 DIAGNOSIS — M9901 Segmental and somatic dysfunction of cervical region: Secondary | ICD-10-CM | POA: Diagnosis not present

## 2017-10-07 DIAGNOSIS — M9902 Segmental and somatic dysfunction of thoracic region: Secondary | ICD-10-CM | POA: Diagnosis not present

## 2017-10-07 DIAGNOSIS — M9903 Segmental and somatic dysfunction of lumbar region: Secondary | ICD-10-CM | POA: Diagnosis not present

## 2017-10-08 DIAGNOSIS — R300 Dysuria: Secondary | ICD-10-CM | POA: Diagnosis not present

## 2017-10-08 DIAGNOSIS — L309 Dermatitis, unspecified: Secondary | ICD-10-CM | POA: Diagnosis not present

## 2017-10-26 DIAGNOSIS — M8580 Other specified disorders of bone density and structure, unspecified site: Secondary | ICD-10-CM | POA: Diagnosis not present

## 2017-10-26 DIAGNOSIS — Z0000 Encounter for general adult medical examination without abnormal findings: Secondary | ICD-10-CM | POA: Diagnosis not present

## 2017-10-26 DIAGNOSIS — R300 Dysuria: Secondary | ICD-10-CM | POA: Diagnosis not present

## 2017-10-26 DIAGNOSIS — E559 Vitamin D deficiency, unspecified: Secondary | ICD-10-CM | POA: Diagnosis not present

## 2017-10-26 DIAGNOSIS — N393 Stress incontinence (female) (male): Secondary | ICD-10-CM | POA: Diagnosis not present

## 2017-10-26 DIAGNOSIS — Z79899 Other long term (current) drug therapy: Secondary | ICD-10-CM | POA: Diagnosis not present

## 2017-10-26 DIAGNOSIS — F411 Generalized anxiety disorder: Secondary | ICD-10-CM | POA: Diagnosis not present

## 2017-10-26 DIAGNOSIS — M150 Primary generalized (osteo)arthritis: Secondary | ICD-10-CM | POA: Diagnosis not present

## 2017-10-26 DIAGNOSIS — K449 Diaphragmatic hernia without obstruction or gangrene: Secondary | ICD-10-CM | POA: Diagnosis not present

## 2017-10-26 DIAGNOSIS — M545 Low back pain: Secondary | ICD-10-CM | POA: Diagnosis not present

## 2017-10-26 DIAGNOSIS — K219 Gastro-esophageal reflux disease without esophagitis: Secondary | ICD-10-CM | POA: Diagnosis not present

## 2017-10-26 DIAGNOSIS — N3281 Overactive bladder: Secondary | ICD-10-CM | POA: Diagnosis not present

## 2017-11-16 DIAGNOSIS — R7301 Impaired fasting glucose: Secondary | ICD-10-CM | POA: Diagnosis not present

## 2017-12-21 DIAGNOSIS — M5416 Radiculopathy, lumbar region: Secondary | ICD-10-CM | POA: Diagnosis not present

## 2017-12-21 DIAGNOSIS — M5414 Radiculopathy, thoracic region: Secondary | ICD-10-CM | POA: Diagnosis not present

## 2017-12-21 DIAGNOSIS — M531 Cervicobrachial syndrome: Secondary | ICD-10-CM | POA: Diagnosis not present

## 2017-12-21 DIAGNOSIS — M9902 Segmental and somatic dysfunction of thoracic region: Secondary | ICD-10-CM | POA: Diagnosis not present

## 2017-12-21 DIAGNOSIS — M9903 Segmental and somatic dysfunction of lumbar region: Secondary | ICD-10-CM | POA: Diagnosis not present

## 2017-12-21 DIAGNOSIS — M9901 Segmental and somatic dysfunction of cervical region: Secondary | ICD-10-CM | POA: Diagnosis not present

## 2017-12-23 DIAGNOSIS — M9903 Segmental and somatic dysfunction of lumbar region: Secondary | ICD-10-CM | POA: Diagnosis not present

## 2017-12-23 DIAGNOSIS — M9901 Segmental and somatic dysfunction of cervical region: Secondary | ICD-10-CM | POA: Diagnosis not present

## 2017-12-23 DIAGNOSIS — M531 Cervicobrachial syndrome: Secondary | ICD-10-CM | POA: Diagnosis not present

## 2017-12-23 DIAGNOSIS — M9902 Segmental and somatic dysfunction of thoracic region: Secondary | ICD-10-CM | POA: Diagnosis not present

## 2017-12-23 DIAGNOSIS — M5416 Radiculopathy, lumbar region: Secondary | ICD-10-CM | POA: Diagnosis not present

## 2017-12-23 DIAGNOSIS — M5414 Radiculopathy, thoracic region: Secondary | ICD-10-CM | POA: Diagnosis not present

## 2017-12-24 DIAGNOSIS — M8588 Other specified disorders of bone density and structure, other site: Secondary | ICD-10-CM | POA: Diagnosis not present

## 2017-12-24 DIAGNOSIS — E559 Vitamin D deficiency, unspecified: Secondary | ICD-10-CM | POA: Diagnosis not present

## 2018-01-28 ENCOUNTER — Ambulatory Visit
Admission: RE | Admit: 2018-01-28 | Discharge: 2018-01-28 | Disposition: A | Discharge status: Home / Self Care | Payer: No Typology Code available for payment source | Source: Ambulatory Visit | Attending: Family Medicine | Admitting: Family Medicine

## 2018-01-28 ENCOUNTER — Other Ambulatory Visit: Payer: Self-pay | Admitting: Family Medicine

## 2018-01-28 DIAGNOSIS — M546 Pain in thoracic spine: Secondary | ICD-10-CM

## 2018-01-28 DIAGNOSIS — M4804 Spinal stenosis, thoracic region: Secondary | ICD-10-CM | POA: Diagnosis not present

## 2018-01-28 DIAGNOSIS — M4184 Other forms of scoliosis, thoracic region: Secondary | ICD-10-CM | POA: Diagnosis not present

## 2018-01-28 DIAGNOSIS — M47814 Spondylosis without myelopathy or radiculopathy, thoracic region: Secondary | ICD-10-CM | POA: Diagnosis not present

## 2018-01-28 DIAGNOSIS — R0781 Pleurodynia: Secondary | ICD-10-CM | POA: Diagnosis not present

## 2018-03-23 DIAGNOSIS — M5134 Other intervertebral disc degeneration, thoracic region: Secondary | ICD-10-CM | POA: Diagnosis not present

## 2018-03-23 DIAGNOSIS — M9901 Segmental and somatic dysfunction of cervical region: Secondary | ICD-10-CM | POA: Diagnosis not present

## 2018-03-23 DIAGNOSIS — M5032 Other cervical disc degeneration, mid-cervical region, unspecified level: Secondary | ICD-10-CM | POA: Diagnosis not present

## 2018-03-23 DIAGNOSIS — M5136 Other intervertebral disc degeneration, lumbar region: Secondary | ICD-10-CM | POA: Diagnosis not present

## 2018-03-23 DIAGNOSIS — M9902 Segmental and somatic dysfunction of thoracic region: Secondary | ICD-10-CM | POA: Diagnosis not present

## 2018-03-23 DIAGNOSIS — M9900 Segmental and somatic dysfunction of head region: Secondary | ICD-10-CM | POA: Diagnosis not present

## 2018-03-30 DIAGNOSIS — M9900 Segmental and somatic dysfunction of head region: Secondary | ICD-10-CM | POA: Diagnosis not present

## 2018-03-30 DIAGNOSIS — M5136 Other intervertebral disc degeneration, lumbar region: Secondary | ICD-10-CM | POA: Diagnosis not present

## 2018-03-30 DIAGNOSIS — M5134 Other intervertebral disc degeneration, thoracic region: Secondary | ICD-10-CM | POA: Diagnosis not present

## 2018-03-30 DIAGNOSIS — M9902 Segmental and somatic dysfunction of thoracic region: Secondary | ICD-10-CM | POA: Diagnosis not present

## 2018-03-30 DIAGNOSIS — M9901 Segmental and somatic dysfunction of cervical region: Secondary | ICD-10-CM | POA: Diagnosis not present

## 2018-03-30 DIAGNOSIS — M5032 Other cervical disc degeneration, mid-cervical region, unspecified level: Secondary | ICD-10-CM | POA: Diagnosis not present

## 2018-05-24 DIAGNOSIS — R7303 Prediabetes: Secondary | ICD-10-CM | POA: Diagnosis not present

## 2018-06-16 DIAGNOSIS — R0989 Other specified symptoms and signs involving the circulatory and respiratory systems: Secondary | ICD-10-CM | POA: Diagnosis not present

## 2018-06-16 DIAGNOSIS — R05 Cough: Secondary | ICD-10-CM | POA: Diagnosis not present

## 2018-06-16 DIAGNOSIS — K219 Gastro-esophageal reflux disease without esophagitis: Secondary | ICD-10-CM | POA: Diagnosis not present

## 2018-07-05 DIAGNOSIS — R0789 Other chest pain: Secondary | ICD-10-CM | POA: Diagnosis not present

## 2018-07-15 DIAGNOSIS — K219 Gastro-esophageal reflux disease without esophagitis: Secondary | ICD-10-CM | POA: Diagnosis not present

## 2018-07-15 DIAGNOSIS — Z23 Encounter for immunization: Secondary | ICD-10-CM | POA: Diagnosis not present

## 2018-08-18 DIAGNOSIS — Z1231 Encounter for screening mammogram for malignant neoplasm of breast: Secondary | ICD-10-CM | POA: Diagnosis not present

## 2018-10-05 DIAGNOSIS — R300 Dysuria: Secondary | ICD-10-CM | POA: Diagnosis not present

## 2018-11-02 DIAGNOSIS — M9902 Segmental and somatic dysfunction of thoracic region: Secondary | ICD-10-CM | POA: Diagnosis not present

## 2018-11-02 DIAGNOSIS — M9900 Segmental and somatic dysfunction of head region: Secondary | ICD-10-CM | POA: Diagnosis not present

## 2018-11-02 DIAGNOSIS — M5136 Other intervertebral disc degeneration, lumbar region: Secondary | ICD-10-CM | POA: Diagnosis not present

## 2018-11-02 DIAGNOSIS — M5032 Other cervical disc degeneration, mid-cervical region, unspecified level: Secondary | ICD-10-CM | POA: Diagnosis not present

## 2018-11-02 DIAGNOSIS — M9901 Segmental and somatic dysfunction of cervical region: Secondary | ICD-10-CM | POA: Diagnosis not present

## 2018-11-02 DIAGNOSIS — M5134 Other intervertebral disc degeneration, thoracic region: Secondary | ICD-10-CM | POA: Diagnosis not present

## 2018-11-09 ENCOUNTER — Other Ambulatory Visit: Payer: Self-pay | Admitting: Family Medicine

## 2018-11-09 ENCOUNTER — Ambulatory Visit
Admission: RE | Admit: 2018-11-09 | Discharge: 2018-11-09 | Disposition: A | Discharge status: Home / Self Care | Payer: PPO | Source: Ambulatory Visit | Attending: Family Medicine | Admitting: Family Medicine

## 2018-11-09 DIAGNOSIS — K219 Gastro-esophageal reflux disease without esophagitis: Secondary | ICD-10-CM | POA: Diagnosis not present

## 2018-11-09 DIAGNOSIS — R059 Cough, unspecified: Secondary | ICD-10-CM

## 2018-11-09 DIAGNOSIS — R05 Cough: Secondary | ICD-10-CM

## 2018-11-09 DIAGNOSIS — K449 Diaphragmatic hernia without obstruction or gangrene: Secondary | ICD-10-CM | POA: Diagnosis not present

## 2018-11-09 DIAGNOSIS — E559 Vitamin D deficiency, unspecified: Secondary | ICD-10-CM | POA: Diagnosis not present

## 2018-11-09 DIAGNOSIS — Z0000 Encounter for general adult medical examination without abnormal findings: Secondary | ICD-10-CM | POA: Diagnosis not present

## 2018-11-09 DIAGNOSIS — M150 Primary generalized (osteo)arthritis: Secondary | ICD-10-CM | POA: Diagnosis not present

## 2018-11-09 DIAGNOSIS — F411 Generalized anxiety disorder: Secondary | ICD-10-CM | POA: Diagnosis not present

## 2018-11-09 DIAGNOSIS — Z79899 Other long term (current) drug therapy: Secondary | ICD-10-CM | POA: Diagnosis not present

## 2018-11-09 DIAGNOSIS — R7303 Prediabetes: Secondary | ICD-10-CM | POA: Diagnosis not present

## 2018-11-09 DIAGNOSIS — N3281 Overactive bladder: Secondary | ICD-10-CM | POA: Diagnosis not present

## 2018-11-09 DIAGNOSIS — N393 Stress incontinence (female) (male): Secondary | ICD-10-CM | POA: Diagnosis not present

## 2018-11-09 DIAGNOSIS — M8580 Other specified disorders of bone density and structure, unspecified site: Secondary | ICD-10-CM | POA: Diagnosis not present

## 2018-11-09 DIAGNOSIS — J42 Unspecified chronic bronchitis: Secondary | ICD-10-CM | POA: Diagnosis not present

## 2018-11-16 DIAGNOSIS — H5231 Anisometropia: Secondary | ICD-10-CM | POA: Diagnosis not present

## 2018-11-16 DIAGNOSIS — H353131 Nonexudative age-related macular degeneration, bilateral, early dry stage: Secondary | ICD-10-CM | POA: Diagnosis not present

## 2018-11-16 DIAGNOSIS — H40013 Open angle with borderline findings, low risk, bilateral: Secondary | ICD-10-CM | POA: Diagnosis not present

## 2018-11-16 DIAGNOSIS — H18413 Arcus senilis, bilateral: Secondary | ICD-10-CM | POA: Diagnosis not present

## 2018-11-16 DIAGNOSIS — H11153 Pinguecula, bilateral: Secondary | ICD-10-CM | POA: Diagnosis not present

## 2018-11-16 DIAGNOSIS — H16223 Keratoconjunctivitis sicca, not specified as Sjogren's, bilateral: Secondary | ICD-10-CM | POA: Diagnosis not present

## 2019-02-20 IMAGING — CR DG THORACIC SPINE 3V
3 series · 3 of 3 positions shown · non-contrast
Comparison: Chest x-ray 10/24/2015

CLINICAL DATA: Back pain

EXAM:
THORACIC SPINE - 3 VIEWS

[t t-spine a.p.]
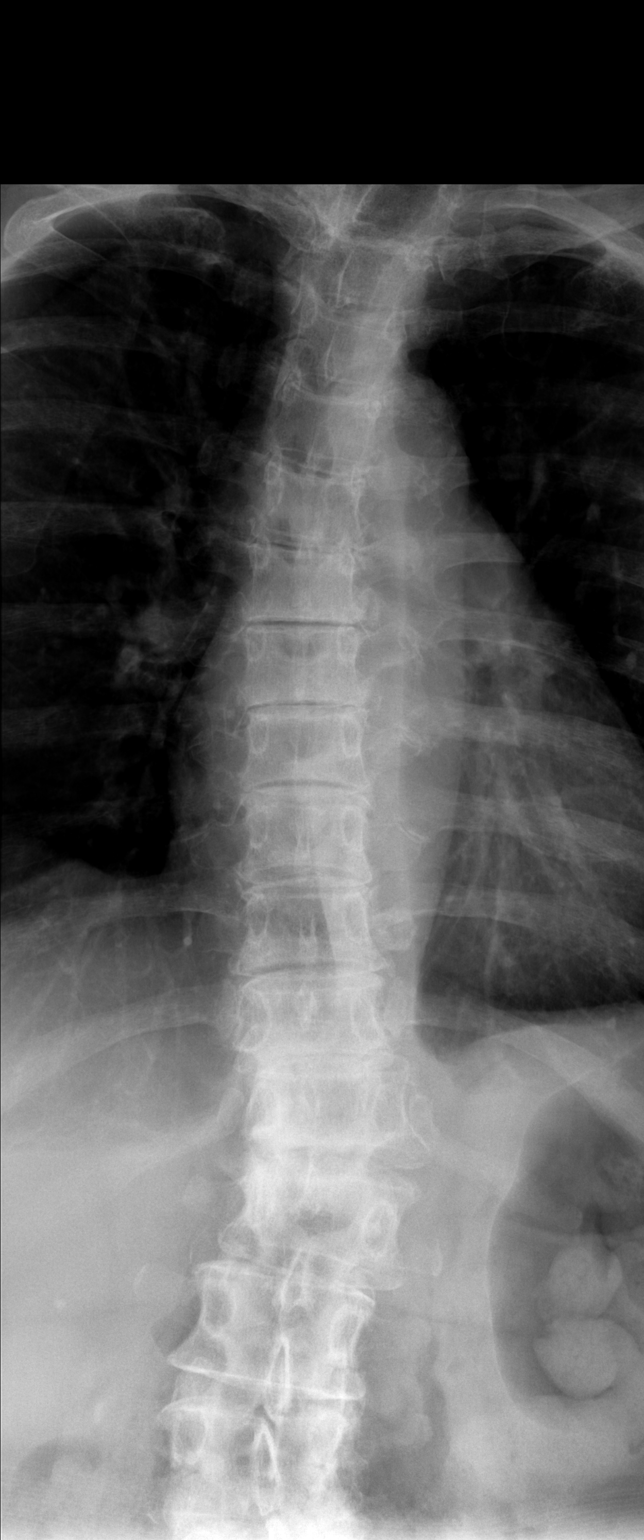

[t t-spine lat]
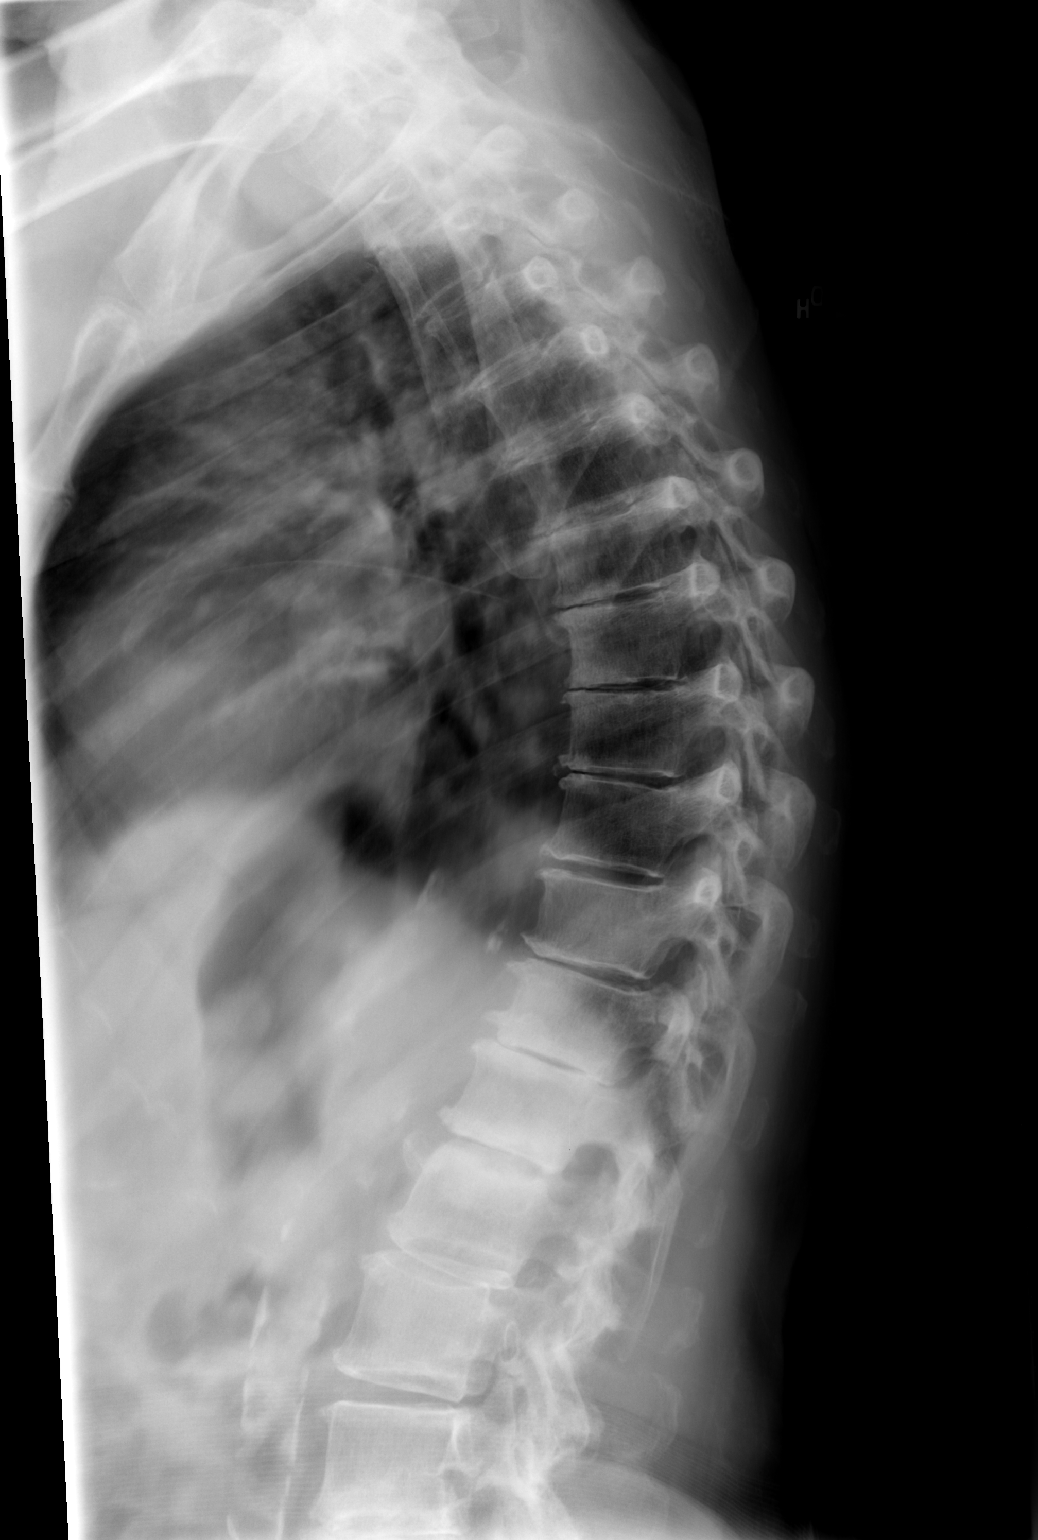

[t swimmers]
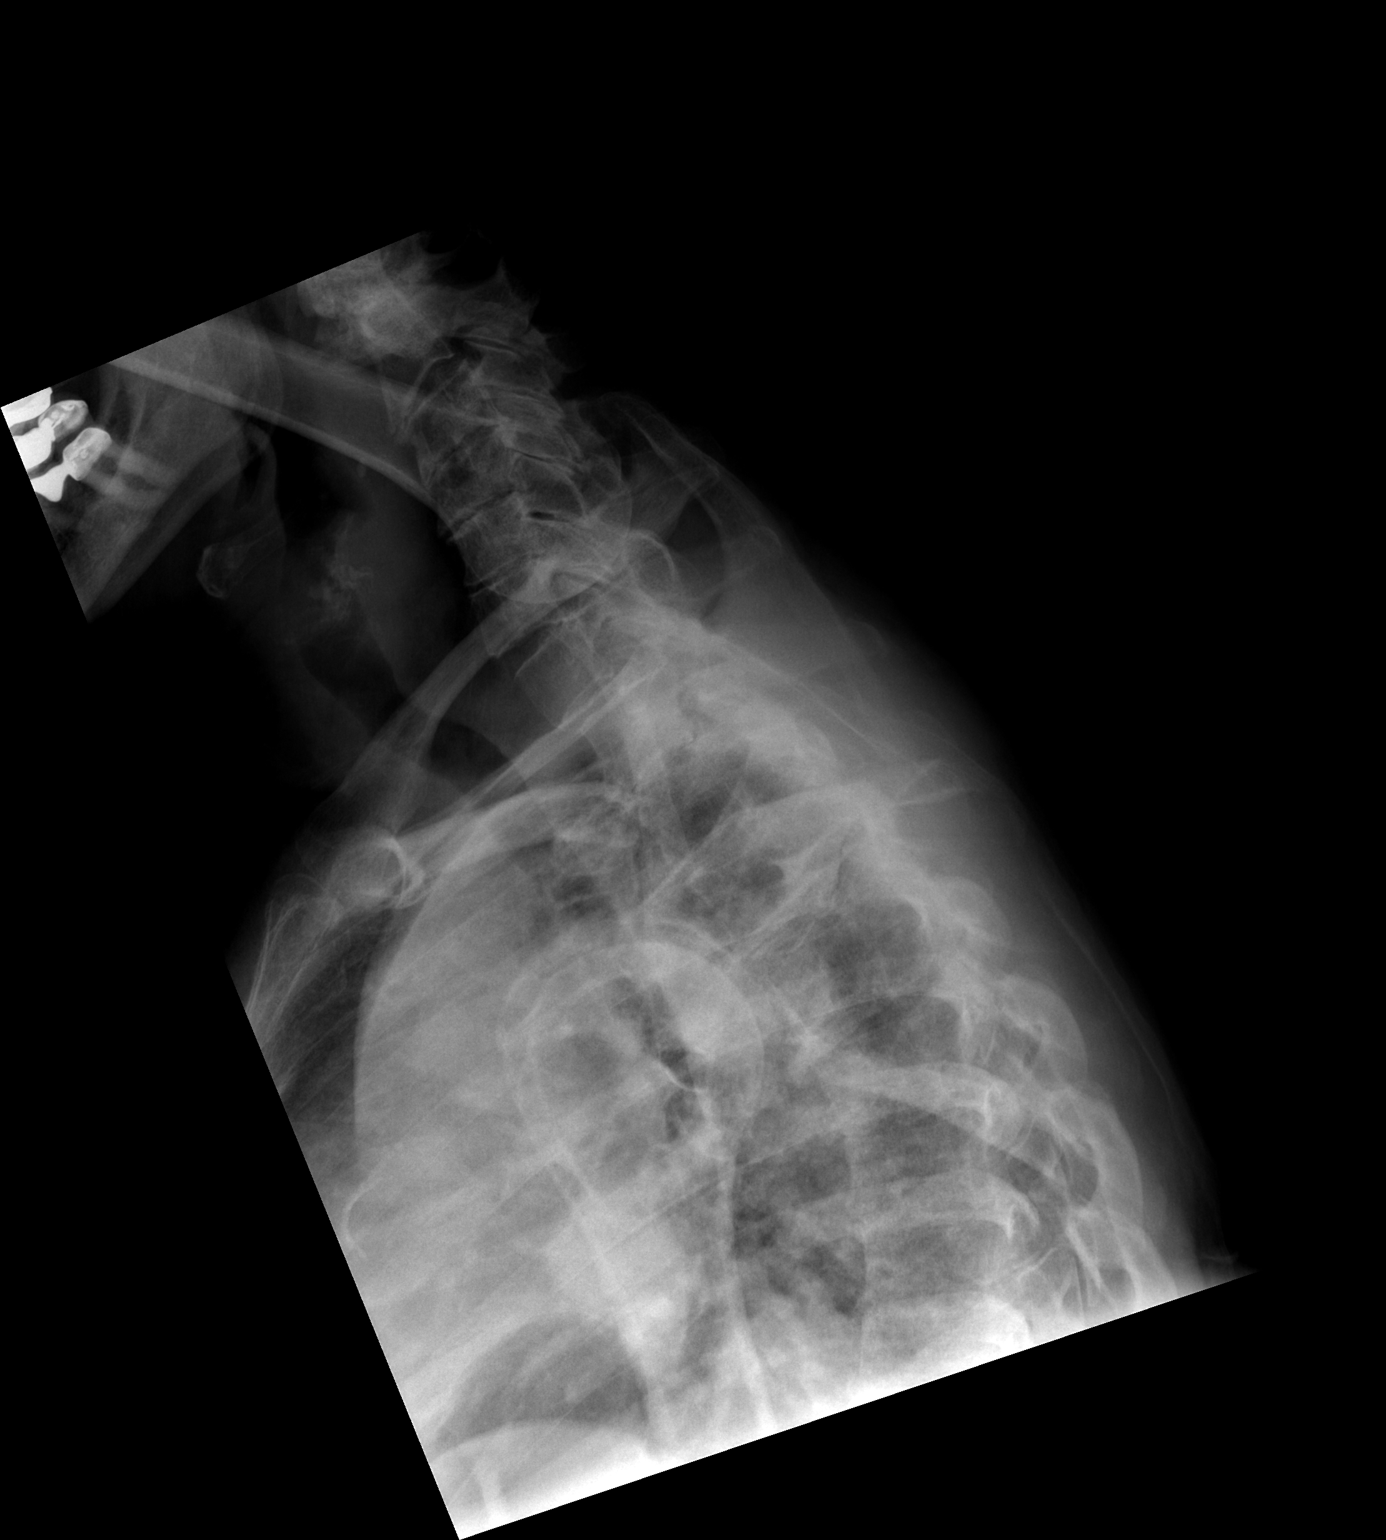

[3 of 3 positions shown; findings below may reference images not displayed]

FINDINGS: Scoliosis of the spine. Vertebral body heights are maintained. Mild
diffuse degenerative changes of the thoracic spine with disc space
narrowing and degenerative osteophytes. Mild endplate changes at the
thoracolumbar junction.
IMPRESSION: Scoliosis with degenerative changes. No definite acute osseous
abnormality.

## 2019-03-30 DIAGNOSIS — M5136 Other intervertebral disc degeneration, lumbar region: Secondary | ICD-10-CM | POA: Diagnosis not present

## 2019-03-30 DIAGNOSIS — M9901 Segmental and somatic dysfunction of cervical region: Secondary | ICD-10-CM | POA: Diagnosis not present

## 2019-03-30 DIAGNOSIS — M9900 Segmental and somatic dysfunction of head region: Secondary | ICD-10-CM | POA: Diagnosis not present

## 2019-03-30 DIAGNOSIS — M5134 Other intervertebral disc degeneration, thoracic region: Secondary | ICD-10-CM | POA: Diagnosis not present

## 2019-03-30 DIAGNOSIS — M9902 Segmental and somatic dysfunction of thoracic region: Secondary | ICD-10-CM | POA: Diagnosis not present

## 2019-03-30 DIAGNOSIS — M5032 Other cervical disc degeneration, mid-cervical region, unspecified level: Secondary | ICD-10-CM | POA: Diagnosis not present

## 2019-04-06 DIAGNOSIS — M5136 Other intervertebral disc degeneration, lumbar region: Secondary | ICD-10-CM | POA: Diagnosis not present

## 2019-04-06 DIAGNOSIS — M9902 Segmental and somatic dysfunction of thoracic region: Secondary | ICD-10-CM | POA: Diagnosis not present

## 2019-04-06 DIAGNOSIS — M9900 Segmental and somatic dysfunction of head region: Secondary | ICD-10-CM | POA: Diagnosis not present

## 2019-04-06 DIAGNOSIS — M9901 Segmental and somatic dysfunction of cervical region: Secondary | ICD-10-CM | POA: Diagnosis not present

## 2019-04-06 DIAGNOSIS — M5134 Other intervertebral disc degeneration, thoracic region: Secondary | ICD-10-CM | POA: Diagnosis not present

## 2019-04-06 DIAGNOSIS — M5032 Other cervical disc degeneration, mid-cervical region, unspecified level: Secondary | ICD-10-CM | POA: Diagnosis not present

## 2019-04-11 DIAGNOSIS — M5136 Other intervertebral disc degeneration, lumbar region: Secondary | ICD-10-CM | POA: Diagnosis not present

## 2019-04-11 DIAGNOSIS — M5134 Other intervertebral disc degeneration, thoracic region: Secondary | ICD-10-CM | POA: Diagnosis not present

## 2019-04-11 DIAGNOSIS — M9901 Segmental and somatic dysfunction of cervical region: Secondary | ICD-10-CM | POA: Diagnosis not present

## 2019-04-11 DIAGNOSIS — M5032 Other cervical disc degeneration, mid-cervical region, unspecified level: Secondary | ICD-10-CM | POA: Diagnosis not present

## 2019-04-11 DIAGNOSIS — M9902 Segmental and somatic dysfunction of thoracic region: Secondary | ICD-10-CM | POA: Diagnosis not present

## 2019-04-11 DIAGNOSIS — M9900 Segmental and somatic dysfunction of head region: Secondary | ICD-10-CM | POA: Diagnosis not present

## 2019-04-13 DIAGNOSIS — M9901 Segmental and somatic dysfunction of cervical region: Secondary | ICD-10-CM | POA: Diagnosis not present

## 2019-04-13 DIAGNOSIS — M9902 Segmental and somatic dysfunction of thoracic region: Secondary | ICD-10-CM | POA: Diagnosis not present

## 2019-04-13 DIAGNOSIS — M5136 Other intervertebral disc degeneration, lumbar region: Secondary | ICD-10-CM | POA: Diagnosis not present

## 2019-04-13 DIAGNOSIS — M5134 Other intervertebral disc degeneration, thoracic region: Secondary | ICD-10-CM | POA: Diagnosis not present

## 2019-04-13 DIAGNOSIS — M5032 Other cervical disc degeneration, mid-cervical region, unspecified level: Secondary | ICD-10-CM | POA: Diagnosis not present

## 2019-04-13 DIAGNOSIS — M9900 Segmental and somatic dysfunction of head region: Secondary | ICD-10-CM | POA: Diagnosis not present

## 2019-06-28 DIAGNOSIS — M5134 Other intervertebral disc degeneration, thoracic region: Secondary | ICD-10-CM | POA: Diagnosis not present

## 2019-06-28 DIAGNOSIS — M9900 Segmental and somatic dysfunction of head region: Secondary | ICD-10-CM | POA: Diagnosis not present

## 2019-06-28 DIAGNOSIS — M5136 Other intervertebral disc degeneration, lumbar region: Secondary | ICD-10-CM | POA: Diagnosis not present

## 2019-06-28 DIAGNOSIS — M9902 Segmental and somatic dysfunction of thoracic region: Secondary | ICD-10-CM | POA: Diagnosis not present

## 2019-06-28 DIAGNOSIS — M5032 Other cervical disc degeneration, mid-cervical region, unspecified level: Secondary | ICD-10-CM | POA: Diagnosis not present

## 2019-06-28 DIAGNOSIS — M9901 Segmental and somatic dysfunction of cervical region: Secondary | ICD-10-CM | POA: Diagnosis not present

## 2019-07-09 DIAGNOSIS — Z23 Encounter for immunization: Secondary | ICD-10-CM | POA: Diagnosis not present

## 2019-08-01 DIAGNOSIS — E559 Vitamin D deficiency, unspecified: Secondary | ICD-10-CM | POA: Diagnosis not present

## 2019-08-01 DIAGNOSIS — E782 Mixed hyperlipidemia: Secondary | ICD-10-CM | POA: Diagnosis not present

## 2019-08-01 DIAGNOSIS — R7303 Prediabetes: Secondary | ICD-10-CM | POA: Diagnosis not present

## 2019-09-01 DIAGNOSIS — Z1231 Encounter for screening mammogram for malignant neoplasm of breast: Secondary | ICD-10-CM | POA: Diagnosis not present

## 2019-09-08 DIAGNOSIS — N6311 Unspecified lump in the right breast, upper outer quadrant: Secondary | ICD-10-CM | POA: Diagnosis not present

## 2019-10-04 ENCOUNTER — Other Ambulatory Visit: Payer: Self-pay | Admitting: Radiology

## 2019-10-04 DIAGNOSIS — N6311 Unspecified lump in the right breast, upper outer quadrant: Secondary | ICD-10-CM | POA: Diagnosis not present

## 2019-10-04 DIAGNOSIS — D1809 Hemangioma of other sites: Secondary | ICD-10-CM | POA: Diagnosis not present

## 2019-11-08 ENCOUNTER — Other Ambulatory Visit: Payer: Self-pay

## 2019-11-08 ENCOUNTER — Emergency Department (HOSPITAL_BASED_OUTPATIENT_CLINIC_OR_DEPARTMENT_OTHER): Payer: PPO

## 2019-11-08 ENCOUNTER — Encounter (HOSPITAL_BASED_OUTPATIENT_CLINIC_OR_DEPARTMENT_OTHER): Payer: Self-pay | Admitting: Emergency Medicine

## 2019-11-08 ENCOUNTER — Emergency Department (HOSPITAL_BASED_OUTPATIENT_CLINIC_OR_DEPARTMENT_OTHER)
Admission: EM | Admit: 2019-11-08 | Discharge: 2019-11-08 | Disposition: A | Discharge status: Home / Self Care | Payer: PPO | Attending: Emergency Medicine | Admitting: Emergency Medicine

## 2019-11-08 DIAGNOSIS — Z79899 Other long term (current) drug therapy: Secondary | ICD-10-CM | POA: Insufficient documentation

## 2019-11-08 DIAGNOSIS — R011 Cardiac murmur, unspecified: Secondary | ICD-10-CM | POA: Insufficient documentation

## 2019-11-08 DIAGNOSIS — Z883 Allergy status to other anti-infective agents status: Secondary | ICD-10-CM | POA: Diagnosis not present

## 2019-11-08 DIAGNOSIS — R0789 Other chest pain: Secondary | ICD-10-CM | POA: Diagnosis not present

## 2019-11-08 DIAGNOSIS — Z882 Allergy status to sulfonamides status: Secondary | ICD-10-CM | POA: Diagnosis not present

## 2019-11-08 DIAGNOSIS — K219 Gastro-esophageal reflux disease without esophagitis: Secondary | ICD-10-CM | POA: Insufficient documentation

## 2019-11-08 DIAGNOSIS — R079 Chest pain, unspecified: Secondary | ICD-10-CM | POA: Diagnosis not present

## 2019-11-08 LAB — TROPONIN I (HIGH SENSITIVITY)
Troponin I (High Sensitivity): <2 ng/L (ref <18)
Troponin I (High Sensitivity): 2 ng/L (ref <18)

## 2019-11-08 LAB — CBC WITH DIFFERENTIAL/PLATELET
Abs Immature Granulocytes: 0.03 10*3/uL (ref 0.00–0.07)
Basophils Absolute: 0.1 10*3/uL (ref 0.0–0.1)
Basophils Relative: 1 %
Eosinophils Absolute: 0.5 10*3/uL (ref 0.0–0.5)
Eosinophils Relative: 5 %
HCT: 41.4 % (ref 36.0–46.0)
Hemoglobin: 13.5 g/dL (ref 12.0–15.0)
Immature Granulocytes: 0 %
Lymphocytes Relative: 22 %
Lymphs Abs: 2.3 10*3/uL (ref 0.7–4.0)
MCH: 30.8 pg (ref 26.0–34.0)
MCHC: 32.6 g/dL (ref 30.0–36.0)
MCV: 94.5 fL (ref 80.0–100.0)
Monocytes Absolute: 0.8 10*3/uL (ref 0.1–1.0)
Monocytes Relative: 8 %
Neutro Abs: 6.7 10*3/uL (ref 1.7–7.7)
Neutrophils Relative %: 64 %
Platelets: 279 10*3/uL (ref 150–400)
RBC: 4.38 MIL/uL (ref 3.87–5.11)
RDW: 12.2 % (ref 11.5–15.5)
WBC: 10.4 10*3/uL (ref 4.0–10.5)
nRBC: 0 % (ref 0.0–0.2)

## 2019-11-08 LAB — BASIC METABOLIC PANEL
Anion gap: 13 (ref 5–15)
BUN: 16 mg/dL (ref 8–23)
CO2: 23 mmol/L (ref 22–32)
Calcium: 9.2 mg/dL (ref 8.9–10.3)
Chloride: 103 mmol/L (ref 98–111)
Creatinine, Ser: 0.73 mg/dL (ref 0.44–1.00)
GFR calc Af Amer: >60 mL/min (ref >60)
GFR calc non Af Amer: >60 mL/min (ref >60)
Glucose, Bld: 105 mg/dL — ABNORMAL HIGH (ref 70–99)
Potassium: 4.1 mmol/L (ref 3.5–5.1)
Sodium: 139 mmol/L (ref 135–145)

## 2019-11-08 MED ORDER — SUCRALFATE 1 GM/10ML PO SUSP
1.0000 g | Freq: Once | ORAL | Status: AC
  Administered 2019-11-08: 11:00:00 1 g via ORAL
  Filled 2019-11-08: qty 10

## 2019-11-08 MED ORDER — ALUM & MAG HYDROXIDE-SIMETH 200-200-20 MG/5ML PO SUSP: 30.0000 mL | Freq: Once | ORAL | Status: DC

## 2019-11-08 MED ORDER — LIDOCAINE VISCOUS HCL 2 % MT SOLN
15.0000 mL | Freq: Once | OROMUCOSAL | Status: AC
  Administered 2019-11-08: 15 mL via ORAL
  Filled 2019-11-08: qty 15

## 2019-11-08 MED ORDER — ALUM & MAG HYDROXIDE-SIMETH 200-200-20 MG/5ML PO SUSP
30.0000 mL | Freq: Once | ORAL | Status: AC
  Administered 2019-11-08: 30 mL via ORAL
  Filled 2019-11-08: qty 30

## 2019-11-08 NOTE — Discharge Instructions (Signed)
It is recommended you discontinue taking ibuprofen, as this can exacerbate your GERD. If you need to treat general aches and pains, tylenol is a better option for your stomach. Avoid spicy, greasy, acidic foods as this can worsen your symptoms.  Remain sitting upright for at least 45 minutes after meals. Please establish new care with a GI specialist. In the interim, follow up with your primary care provider in the next few days.  Return to the ER for new or worsening symptoms; including worsening chest pain, shortness of breath, pain that radiates to the arm or neck, pain or shortness of breath worsened with exertion.

## 2019-11-08 NOTE — ED Triage Notes (Signed)
Pt reports chest pressure and burning x 1 week, woken up at 430 this morning wit central chest pressure, no radiation, denies shortness of breath nor nausea. Alert and oriented x 4. Hx GERD.

## 2019-11-08 NOTE — ED Provider Notes (Signed)
[facility] EM[facility]ENCY DEPARTMENT Provider Note   CSN: [id number] Arrival date & time: 11/08/19  1036     History Chief Complaint  Patient presents with  . Chest Pain    Taylor Jacobs is a 85 y.o. female w PMHx GERD, presenting to the ED with complaint of 1 week of burning chest pain. Pt describes pain as a constant burning sensation, similar to sx of GERD. She treats GERD with omeprazole and  Dexilant. She states sometimes she uses mylanta as well, however symptoms have been persisting. Symptoms are not aggravated with exertion or meals. She denies assoc radiation of pain, nausea, diaphoresis, abd pain, SOB. No known cardiac hx. No hx of HTN, HLD, DM, smoking. She denies alcohol use, but does take ibuprofen 1-2 times per day for regular aches and pains.  The history is provided by the patient.       Past Medical History:  Diagnosis Date  . GERD (gastroesophageal reflux disease)   . Headache   . Heart murmur     There are no problems to display for this patient.   Past Surgical History:  Procedure Laterality Date  . ABDOMINAL HYSTERECTOMY     partial  . CESAREAN SECTION     x 3  . COLONOSCOPY WITH PROPOFOL N/A 12/04/2015   Procedure: COLONOSCOPY WITH PROPOFOL;  Surgeon: Garlan Fair, MD;  Location: [facility];  Service: Endoscopy;  Laterality: N/A;  . ESOPHAGOGASTRODUODENOSCOPY (EGD) WITH PROPOFOL N/A 12/04/2015   Procedure: ESOPHAGOGASTRODUODENOSCOPY (EGD) WITH PROPOFOL;  Surgeon: Garlan Fair, MD;  Location: [facility];  Service: Endoscopy;  Laterality: N/A;  . EYE SURGERY     bilateral cataract      OB History   No obstetric history on file.     No family history on file.  Social History   Tobacco Use  . Smoking status: Never Smoker  Substance Use Topics  . Alcohol use: No  . Drug use: No    Home Medications Prior to Admission medications   Medication Sig Start Date End Date Taking? Authorizing Provider  acetaminophen (TYLENOL) 500  MG tablet Take 1,000 mg by mouth every 6 (six) hours as needed (For headache, pain or fever.).    [provider]  Cholecalciferol (VITAMIN D3) 2000 units TABS Take 2,000 Units by mouth daily.    [provider]  dexlansoprazole (DEXILANT) 60 MG capsule Take 60 mg by mouth daily.    [provider]  Fish Oil-Cholecalciferol (FISH OIL + D3 PO) Take 1 capsule by mouth daily.    [provider]  vitamin C (ASCORBIC ACID) 500 MG tablet Take 1,000 mg by mouth 2 (two) times daily.    [provider]    Allergies    Betadine [povidone iodine] and Sulfa antibiotics  Review of Systems   Review of Systems  Cardiovascular: Positive for chest pain.  All other systems reviewed and are negative.   Physical Exam Updated Vital Signs BP 109/84 (BP Location: Right Arm)   Pulse 75   Temp 97.9 F (36.6 C) (Oral)   Resp 16   Ht 4\' 11"  (1.499 m)   Wt 56.7 kg   SpO2 96%   BMI 25.25 kg/m   Physical Exam Vitals and nursing note reviewed.  Constitutional:      Appearance: She is well-developed.  HENT:     Head: Normocephalic and atraumatic.  Eyes:     Conjunctiva/sclera: Conjunctivae normal.  Cardiovascular:     Rate and Rhythm:  Normal rate and regular rhythm.  Pulmonary:     Effort: Pulmonary effort is normal. No respiratory distress.     Breath sounds: Normal breath sounds.  Abdominal:     General: Bowel sounds are normal.     Palpations: Abdomen is soft.     Tenderness: There is no abdominal tenderness.  Musculoskeletal:     Right lower leg: No edema.     Left lower leg: No edema.  Skin:    General: Skin is warm.  Neurological:     Mental Status: She is alert.  Psychiatric:        Behavior: Behavior normal.     ED Results / Procedures / Treatments   Labs (all labs ordered are listed, but only abnormal results are displayed) Labs Reviewed  BASIC METABOLIC PANEL - Abnormal; Notable for the following components:      Result Value    Glucose, Bld 105 (*)    All other components within normal limits  CBC WITH DIFFERENTIAL/PLATELET  TROPONIN I (HIGH SENSITIVITY)  TROPONIN I (HIGH SENSITIVITY)    EKG EKG Interpretation  Date/Time:  Tuesday November 08 2019 10:49:39 EST Ventricular Rate:  81 PR Interval:    QRS Duration: 116 QT Interval:  396 QTC Calculation: 460 R Axis:   -66 Text Interpretation: Sinus rhythm Left anterior fascicular block Probable left ventricular hypertrophy Anterior Q waves, possibly due to LVH no significant change compared to prior on 10/23/2008 Confirmed by Quintella Reichert [phone]) on 11/08/2019 10:55:23 AM   Radiology DG Chest 2 View  Result Date: 11/08/2019 CLINICAL DATA:  Chest pain EXAM: CHEST - 2 VIEW COMPARISON:  November 09, 2018 FINDINGS: Lungs are clear. Heart size and pulmonary vascularity are normal. No adenopathy. There is aortic atherosclerosis. No pneumothorax. There is thoracolumbar levoscoliosis. IMPRESSION: Lungs clear. Heart size normal. No adenopathy. Aortic Atherosclerosis (ICD10-I70.0). Electronically Signed   By: Lowella Grip III M.D.   On: 11/08/2019 11:25    Procedures Procedures (including critical care time)  Medications Ordered in ED Medications  sucralfate (CARAFATE) 1 GM/10ML suspension 1 g (1 g Oral Given 11/08/19 1120)  alum & mag hydroxide-simeth (MAALOX/MYLANTA) 200-200-20 MG/5ML suspension 30 mL (30 mLs Oral Given 11/08/19 1244)    And  lidocaine (XYLOCAINE) 2 % viscous mouth solution 15 mL (15 mLs Oral Given 11/08/19 1244)    ED Course  I have reviewed the triage vital signs and the nursing notes.  Pertinent labs & imaging results that were available during my care of the patient were reviewed by me and considered in my medical decision making (see chart for details).    MDM Rules/Calculators/A&P                      Pt with known hx GERD, presenting with burning CP x1week. Symptoms feel similar to GERD symptoms, however persisted over the week and  not improved with home medications. CP does not radiate, no assoc nausea, diaphoresis or SOB. Low heart score. EKG without ischemic changes. CXR neg. Labs reassuring, neg trop x2. Low suspicion for ACS. Pt discussed with and evaluated by Dr. Ralene Bathe. Pt is stable for discharge at this time. Encourage she establish new GI specialist, as her prior specialist retired. Recommend continue PPI, discontinue regular NSAID use and switch to tylenol as needed. Return precautions discussed. PT agreeable to plan, safe for discharge.  Discussed results, findings, treatment and follow up. Patient advised of return precautions. Patient verbalized understanding and agreed with plan.  Final  Clinical Impression(s) / ED Diagnoses Final diagnoses:  Gastroesophageal reflux disease, unspecified whether esophagitis present  Atypical chest pain    Rx / DC Orders ED Discharge Orders    None       Rolfe Hartsell, [place] N, PA-C 11/08/19 1627    Quintella Reichert, MD 11/09/19 [phone]

## 2019-11-18 DIAGNOSIS — Z0000 Encounter for general adult medical examination without abnormal findings: Secondary | ICD-10-CM | POA: Diagnosis not present

## 2019-11-18 DIAGNOSIS — M545 Low back pain: Secondary | ICD-10-CM | POA: Diagnosis not present

## 2019-11-18 DIAGNOSIS — N393 Stress incontinence (female) (male): Secondary | ICD-10-CM | POA: Diagnosis not present

## 2019-11-18 DIAGNOSIS — K219 Gastro-esophageal reflux disease without esophagitis: Secondary | ICD-10-CM | POA: Diagnosis not present

## 2019-11-18 DIAGNOSIS — M150 Primary generalized (osteo)arthritis: Secondary | ICD-10-CM | POA: Diagnosis not present

## 2019-11-18 DIAGNOSIS — R7309 Other abnormal glucose: Secondary | ICD-10-CM | POA: Diagnosis not present

## 2019-11-18 DIAGNOSIS — J42 Unspecified chronic bronchitis: Secondary | ICD-10-CM | POA: Diagnosis not present

## 2019-11-18 DIAGNOSIS — K449 Diaphragmatic hernia without obstruction or gangrene: Secondary | ICD-10-CM | POA: Diagnosis not present

## 2019-11-18 DIAGNOSIS — E782 Mixed hyperlipidemia: Secondary | ICD-10-CM | POA: Diagnosis not present

## 2019-11-18 DIAGNOSIS — R1013 Epigastric pain: Secondary | ICD-10-CM | POA: Diagnosis not present

## 2019-11-18 DIAGNOSIS — M8580 Other specified disorders of bone density and structure, unspecified site: Secondary | ICD-10-CM | POA: Diagnosis not present

## 2019-11-18 DIAGNOSIS — E559 Vitamin D deficiency, unspecified: Secondary | ICD-10-CM | POA: Diagnosis not present

## 2019-11-18 DIAGNOSIS — N3281 Overactive bladder: Secondary | ICD-10-CM | POA: Diagnosis not present

## 2019-12-01 DIAGNOSIS — H40013 Open angle with borderline findings, low risk, bilateral: Secondary | ICD-10-CM | POA: Diagnosis not present

## 2019-12-01 DIAGNOSIS — H5231 Anisometropia: Secondary | ICD-10-CM | POA: Diagnosis not present

## 2019-12-01 DIAGNOSIS — H11153 Pinguecula, bilateral: Secondary | ICD-10-CM | POA: Diagnosis not present

## 2019-12-01 DIAGNOSIS — H353131 Nonexudative age-related macular degeneration, bilateral, early dry stage: Secondary | ICD-10-CM | POA: Diagnosis not present

## 2019-12-01 DIAGNOSIS — H18413 Arcus senilis, bilateral: Secondary | ICD-10-CM | POA: Diagnosis not present

## 2019-12-01 DIAGNOSIS — H16223 Keratoconjunctivitis sicca, not specified as Sjogren's, bilateral: Secondary | ICD-10-CM | POA: Diagnosis not present

## 2019-12-01 DIAGNOSIS — Z961 Presence of intraocular lens: Secondary | ICD-10-CM | POA: Diagnosis not present

## 2020-01-23 DIAGNOSIS — M9902 Segmental and somatic dysfunction of thoracic region: Secondary | ICD-10-CM | POA: Diagnosis not present

## 2020-01-23 DIAGNOSIS — M5136 Other intervertebral disc degeneration, lumbar region: Secondary | ICD-10-CM | POA: Diagnosis not present

## 2020-01-23 DIAGNOSIS — M9900 Segmental and somatic dysfunction of head region: Secondary | ICD-10-CM | POA: Diagnosis not present

## 2020-01-23 DIAGNOSIS — M5032 Other cervical disc degeneration, mid-cervical region, unspecified level: Secondary | ICD-10-CM | POA: Diagnosis not present

## 2020-01-23 DIAGNOSIS — M9901 Segmental and somatic dysfunction of cervical region: Secondary | ICD-10-CM | POA: Diagnosis not present

## 2020-01-23 DIAGNOSIS — M5134 Other intervertebral disc degeneration, thoracic region: Secondary | ICD-10-CM | POA: Diagnosis not present

## 2020-01-30 DIAGNOSIS — M5134 Other intervertebral disc degeneration, thoracic region: Secondary | ICD-10-CM | POA: Diagnosis not present

## 2020-01-30 DIAGNOSIS — M9900 Segmental and somatic dysfunction of head region: Secondary | ICD-10-CM | POA: Diagnosis not present

## 2020-01-30 DIAGNOSIS — M5136 Other intervertebral disc degeneration, lumbar region: Secondary | ICD-10-CM | POA: Diagnosis not present

## 2020-01-30 DIAGNOSIS — M5032 Other cervical disc degeneration, mid-cervical region, unspecified level: Secondary | ICD-10-CM | POA: Diagnosis not present

## 2020-01-30 DIAGNOSIS — M9902 Segmental and somatic dysfunction of thoracic region: Secondary | ICD-10-CM | POA: Diagnosis not present

## 2020-01-30 DIAGNOSIS — M9901 Segmental and somatic dysfunction of cervical region: Secondary | ICD-10-CM | POA: Diagnosis not present

## 2020-02-06 DIAGNOSIS — M5134 Other intervertebral disc degeneration, thoracic region: Secondary | ICD-10-CM | POA: Diagnosis not present

## 2020-02-06 DIAGNOSIS — M5136 Other intervertebral disc degeneration, lumbar region: Secondary | ICD-10-CM | POA: Diagnosis not present

## 2020-02-06 DIAGNOSIS — M9900 Segmental and somatic dysfunction of head region: Secondary | ICD-10-CM | POA: Diagnosis not present

## 2020-02-06 DIAGNOSIS — M9902 Segmental and somatic dysfunction of thoracic region: Secondary | ICD-10-CM | POA: Diagnosis not present

## 2020-02-06 DIAGNOSIS — M9901 Segmental and somatic dysfunction of cervical region: Secondary | ICD-10-CM | POA: Diagnosis not present

## 2020-02-06 DIAGNOSIS — M5032 Other cervical disc degeneration, mid-cervical region, unspecified level: Secondary | ICD-10-CM | POA: Diagnosis not present

## 2020-02-27 DIAGNOSIS — K219 Gastro-esophageal reflux disease without esophagitis: Secondary | ICD-10-CM | POA: Diagnosis not present

## 2020-02-27 DIAGNOSIS — R079 Chest pain, unspecified: Secondary | ICD-10-CM | POA: Diagnosis not present

## 2020-03-14 ENCOUNTER — Other Ambulatory Visit: Payer: Self-pay

## 2020-03-14 ENCOUNTER — Ambulatory Visit: Payer: PPO | Admitting: Cardiology

## 2020-03-14 ENCOUNTER — Encounter: Payer: Self-pay | Admitting: *Deleted

## 2020-03-14 VITALS — BP 136/76 | HR 67 | Temp 97.3°F | Resp 18 | Ht 59.0 in | Wt 126.0 lb

## 2020-03-14 DIAGNOSIS — I341 Nonrheumatic mitral (valve) prolapse: Secondary | ICD-10-CM | POA: Diagnosis not present

## 2020-03-14 DIAGNOSIS — R002 Palpitations: Secondary | ICD-10-CM | POA: Diagnosis not present

## 2020-03-14 DIAGNOSIS — R079 Chest pain, unspecified: Secondary | ICD-10-CM | POA: Diagnosis not present

## 2020-03-14 DIAGNOSIS — R011 Cardiac murmur, unspecified: Secondary | ICD-10-CM | POA: Diagnosis not present

## 2020-03-14 DIAGNOSIS — E785 Hyperlipidemia, unspecified: Secondary | ICD-10-CM | POA: Diagnosis not present

## 2020-03-14 LAB — BASIC METABOLIC PANEL
BUN/Creatinine Ratio: 25 (ref 12–28)
BUN: 16 mg/dL (ref 8–27)
CO2: 24 mmol/L (ref 20–29)
Calcium: 9.8 mg/dL (ref 8.7–10.3)
Chloride: 101 mmol/L (ref 96–106)
Creatinine, Ser: 0.63 mg/dL (ref 0.57–1.00)
GFR calc Af Amer: 97 mL/min/{1.73_m2} (ref >59)
GFR calc non Af Amer: 84 mL/min/{1.73_m2} (ref >59)
Glucose: 96 mg/dL (ref 65–99)
Potassium: 4.7 mmol/L (ref 3.5–5.2)
Sodium: 137 mmol/L (ref 134–144)

## 2020-03-14 MED ORDER — METOPROLOL TARTRATE 50 MG PO TABS
ORAL_TABLET | ORAL | 0 refills | Status: DC
Start: 2020-03-14 — End: 2020-05-16

## 2020-03-14 NOTE — Patient Instructions (Signed)
Medication Instructions:  Your physician recommends that you continue on your current medications as directed. Please refer to the Current Medication list given to you today.  *If you need a refill on your cardiac medications before your next appointment, please call your pharmacy*   Lab Work: BMET today  If you have labs (blood work) drawn today and your tests are completely normal, you will receive your results only by: Marland Kitchen MyChart Message (if you have MyChart) OR . A paper copy in the mail If you have any lab test that is abnormal or we need to change your treatment, we will call you to review the results.   Testing/Procedures: Your physician has requested that you have an echocardiogram. Echocardiography is a painless test that uses sound waves to create images of your heart. It provides your doctor with information about the size and shape of your heart and how well your heart's chambers and valves are working. This procedure takes approximately one hour. There are no restrictions for this procedure. This will be done at our [facility] location:  [place] has requested that you have cardiac CT. Cardiac computed tomography (CT) is a painless test that uses an x-ray machine to take clear, detailed pictures of your heart. For further information please visit [email or web address]. Please follow instruction sheet as given.    ZIO XT- Long Term Monitor Instructions   Your physician has requested you wear your ZIO patch monitor 3 days.   This is a single patch monitor.  [employer] supplies one patch monitor per enrollment.  Additional stickers are not available.   Please do not apply patch if you will be having a Nuclear Stress Test, Echocardiogram, Cardiac CT, MRI, or Chest Xray during the time frame you would be wearing the monitor. The patch cannot be worn during these tests.  You cannot remove and re-apply the ZIO XT patch monitor.   Your ZIO patch  monitor will be sent USPS Priority mail from [facility] directly to your home address. The monitor may also be mailed to a PO BOX if home delivery is not available.   It may take 3-5 days to receive your monitor after you have been enrolled.   Once you have received you monitor, please review enclosed instructions.  Your monitor has already been registered assigning a specific monitor serial # to you.   Applying the monitor   Shave hair from upper left chest.   Hold abrader disc by orange tab.  Rub abrader in 40 strokes over left upper chest as indicated in your monitor instructions.   Clean area with 4 enclosed alcohol pads .  Use all pads to assure are is cleaned thoroughly.  Let dry.   Apply patch as indicated in monitor instructions.  Patch will be place under collarbone on left side of chest with arrow pointing upward.   Rub patch adhesive wings for 2 minutes.Remove white label marked "1".  Remove white label marked "2".  Rub patch adhesive wings for 2 additional minutes.   While looking in a mirror, press and release button in center of patch.  A small green light will flash 3-4 times .  This will be your only indicator the monitor has been turned on.     Do not shower for the first 24 hours.  You may shower after the first 24 hours.   Press button if you feel a symptom. You will hear a small click.  Record  Date, Time and Symptom in the Patient Log Book.   When you are ready to remove patch, follow instructions on last 2 pages of Patient Log Book.  Stick patch monitor onto last page of Patient Log Book.   Place Patient Log Book in [place] box.  Use locking tab on box and tape box closed securely.  The [employer] and [employer] has [employer] on it.  Please place in mailbox as soon as possible.  Your physician should have your test results approximately 7 days after the monitor has been mailed back to [facility].   Call [place] at [phone] if you  have questions regarding your ZIO XT patch monitor.  Call them immediately if you see an orange light blinking on your monitor.   If your monitor falls off in less than 4 days contact our Monitor department at [phone].  If your monitor becomes loose or falls off after 4 days call [employer] at [phone] for suggestions on securing your monitor.   Follow-Up: At [facility], you and your health needs are our priority.  As part of our continuing mission to provide you with exceptional heart care, we have created designated Provider Care Teams.  These Care Teams include your primary Cardiologist (physician) and Advanced Practice Providers (APPs -  Physician Assistants and Nurse Practitioners) who all work together to provide you with the care you need, when you need it.  We recommend signing up for the patient portal called "MyChart".  Sign up information is provided on this After Visit Summary.  MyChart is used to connect with patients for Virtual Visits (Telemedicine).  Patients are able to view lab/test results, encounter notes, upcoming appointments, etc.  Non-urgent messages can be sent to your provider as well.   To learn more about what you can do with MyChart, go to [email or web address].    Your next appointment:   2 month(s)  The format for your next appointment:   In Person  Provider:   Oswaldo Milian, MD

## 2020-03-14 NOTE — Progress Notes (Signed)
Patient ID: Taylor Jacobs, female   DOB: 10-Nov-1934, 85 y.o.   MRN: [id number] 3 day ZIO XT long term holter monitor shipped to patients home.

## 2020-03-14 NOTE — Progress Notes (Signed)
Cardiology Office Note:    Date:  03/14/2020   ID:  Taylor Jacobs, DOB [birth year], MRN [id number]  PCP:  Mayra Neer, MD  Cardiologist:  No primary care provider on file.  Electrophysiologist:  None   Referring MD: Lennie Odor, PA   Chief Complaint  Patient presents with  . Chest Pain    History of Present Illness:    Taylor Jacobs is a 85 y.o. female with a hx of GERD, reported MVP, hiatal hernia, hyperlipidemia, prediabetes who is referred by Lennie Odor, PA for an evaluation of chest pain.  She had ED visit with chest pain on 11/08/2019.  She reported that she had been having constant burning pain which she described as being similar to what she gets with GERD.  No relationship with exertion.  Work-up in the ED was negative, including EKG without ischemic changes, unremarkable chest x-ray, and troponins negative x2.  Patient was recommended to follow-up with GI specialist.  She reports that she has been having chest pain for over 6 months.  Described as pressure in the center of her chest that occurs daily.  Usually occurs at night.  States that typically occurs while she is resting, can last for hours.  Prior to the pandemic she was walking 30 minutes/day, but has not been exercising regularly anymore.  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  She reports dyspnea with walking up stairs but denies any chest pain.  She is also having episodes of palpitations that occur every night.  States that feels like heart is racing during episodes, can last for hours.  Mother had aortic aneurysm, had surgery at age 64.  Son has pacemaker, she is unsure of reason.  Never smoked.  Past Medical History:  Diagnosis Date  . GERD (gastroesophageal reflux disease)   . Headache   . Heart murmur     Past Surgical History:  Procedure Laterality Date  . ABDOMINAL HYSTERECTOMY     partial  . CESAREAN SECTION     x 3  . COLONOSCOPY WITH PROPOFOL N/A 12/04/2015     Procedure: COLONOSCOPY WITH PROPOFOL;  Surgeon: Garlan Fair, MD;  Location: [facility];  Service: Endoscopy;  Laterality: N/A;  . ESOPHAGOGASTRODUODENOSCOPY (EGD) WITH PROPOFOL N/A 12/04/2015   Procedure: ESOPHAGOGASTRODUODENOSCOPY (EGD) WITH PROPOFOL;  Surgeon: Garlan Fair, MD;  Location: [facility];  Service: Endoscopy;  Laterality: N/A;  . EYE SURGERY     bilateral cataract     Current Medications: Current Meds  Medication Sig  . acetaminophen (TYLENOL) 500 MG tablet Take 1,000 mg by mouth every 6 (six) hours as needed (For headache, pain or fever.).  Marland Kitchen Cholecalciferol (VITAMIN D3) 2000 units TABS Take 2,000 Units by mouth daily.  Marland Kitchen dexlansoprazole (DEXILANT) 60 MG capsule Take 60 mg by mouth daily.  . Fish Oil-Cholecalciferol (FISH OIL + D3 PO) Take 1 capsule by mouth daily.  . vitamin C (ASCORBIC ACID) 500 MG tablet Take 1,000 mg by mouth 2 (two) times daily.     Allergies:   Betadine [povidone iodine] and Sulfa antibiotics   Social History   Socioeconomic History  . Marital status: Married    Spouse name: Not on file  . Number of children: Not on file  . Years of education: Not on file  . Highest education level: Not on file  Occupational History  . Not on file  Tobacco Use  . Smoking status: Never Smoker  Substance  and Sexual Activity  . Alcohol use: No  . Drug use: No  . Sexual activity: Not on file  Other Topics Concern  . Not on file  Social History Narrative  . Not on file   Social Determinants of Health   Financial Resource Strain:   . Difficulty of Paying Living Expenses:   Food Insecurity:   . Worried About Charity fundraiser in the Last Year:   . Arboriculturist in the Last Year:   Transportation Needs:   . Film/video editor (Medical):   Marland Kitchen Lack of Transportation (Non-Medical):   Physical Activity:   . Days of Exercise per Week:   . Minutes of Exercise per Session:   Stress:   . Feeling of Stress :   Social Connections:   .  Frequency of Communication with Friends and Family:   . Frequency of Social Gatherings with Friends and Family:   . Attends Religious Services:   . Active Member of Clubs or Organizations:   . Attends Archivist Meetings:   Marland Kitchen Marital Status:      Family History: Mother had aortic aneurysm, had surgery at age 37.  Son has pacemaker, she is unsure of reason.  ROS:   Please see the history of present illness.    All other systems reviewed and are negative.  EKGs/Labs/Other Studies Reviewed:    The following studies were reviewed today:   EKG:  EKG is not ordered today (patient declines).  The ekg ordered 02/27/20 demonstrates normal sinus rhythm, rate 69, no ST/T abnormalities, left axis deviation, poor R wave progression  Recent Labs: 11/08/2019: BUN 16; Creatinine, Ser 0.73; Hemoglobin 13.5; Platelets 279; Potassium 4.1; Sodium 139  Recent Lipid Panel No results found for: CHOL, TRIG, HDL, CHOLHDL, VLDL, LDLCALC, LDLDIRECT  Physical Exam:    VS:  BP 136/76   Pulse 67   Temp (!) 97.3 F (36.3 C)   Resp 18   Ht 4\' 11"  (1.499 m)   Wt 126 lb (57.2 kg)   SpO2 97%   BMI 25.45 kg/m     Wt Readings from Last 3 Encounters:  03/14/20 126 lb (57.2 kg)  11/08/19 125 lb (56.7 kg)  12/04/15 141 lb (64 kg)     GEN: in no acute distress HEENT: Normal NECK: No JVD; No carotid bruits LYMPHATICS: No lymphadenopathy CARDIAC: RRR, 2 out of 6 systolic murmur RESPIRATORY:  Clear to auscultation without rales, wheezing or rhonchi  ABDOMEN: Soft, non-tender, non-distended MUSCULOSKELETAL:  No edema; SKIN: Warm and dry NEUROLOGIC:  Alert and oriented x 3 PSYCHIATRIC:  Normal affect   ASSESSMENT:    1. Chest pain, unspecified type   2. Palpitations   3. Murmur   4. MVP (mitral valve prolapse)   5. Hyperlipidemia, unspecified hyperlipidemia type    PLAN:     Chest pain: Atypical in description, but given risk factors (age, hyperlipidemia), would classify as intermediate  risk of obstructive CAD -Coronary CTA  Mitral valve prolapse: Reported history, no recent echocardiogram.  2/6 systolic murmur on exam.  Will check TTE  Palpitations: Description concerning for arrhythmia, will check Zio patch x3 days  Hyperlipidemia: Lipid panel showed LDL 185 on 11/20/2019.  She is not on a statin due to her age.  She would prefer to follow-up results of coronary CTA and consider statin if significant CAD  RTC in 2 months   Medication Adjustments/Labs and Tests Ordered: Current medicines are reviewed at length with the patient  today.  Concerns regarding medicines are outlined above.  Orders Placed This Encounter  Procedures  . CT CORONARY MORPH W/CTA COR W/SCORE W/CA W/CM &/OR WO/CM  . CT CORONARY FRACTIONAL FLOW RESERVE DATA PREP  . CT CORONARY FRACTIONAL FLOW RESERVE FLUID ANALYSIS  . Basic metabolic panel  . LONG TERM MONITOR (3-14 DAYS)  . EKG 12-Lead  . ECHOCARDIOGRAM COMPLETE   Meds ordered this encounter  Medications  . metoprolol tartrate (LOPRESSOR) 50 MG tablet    Sig: Take 50 mg (1 tablet) TWO hours prior to CT    Dispense:  1 tablet    Refill:  0    There are no Patient Instructions on file for this visit.   Signed, Donato Heinz, MD  03/14/2020 10:21 AM    [place]

## 2020-03-20 ENCOUNTER — Other Ambulatory Visit (INDEPENDENT_AMBULATORY_CARE_PROVIDER_SITE_OTHER): Payer: PPO

## 2020-03-20 DIAGNOSIS — R002 Palpitations: Secondary | ICD-10-CM

## 2020-03-27 DIAGNOSIS — M5136 Other intervertebral disc degeneration, lumbar region: Secondary | ICD-10-CM | POA: Diagnosis not present

## 2020-03-27 DIAGNOSIS — M9900 Segmental and somatic dysfunction of head region: Secondary | ICD-10-CM | POA: Diagnosis not present

## 2020-03-27 DIAGNOSIS — M5134 Other intervertebral disc degeneration, thoracic region: Secondary | ICD-10-CM | POA: Diagnosis not present

## 2020-03-27 DIAGNOSIS — M9902 Segmental and somatic dysfunction of thoracic region: Secondary | ICD-10-CM | POA: Diagnosis not present

## 2020-03-27 DIAGNOSIS — M9901 Segmental and somatic dysfunction of cervical region: Secondary | ICD-10-CM | POA: Diagnosis not present

## 2020-03-27 DIAGNOSIS — M5032 Other cervical disc degeneration, mid-cervical region, unspecified level: Secondary | ICD-10-CM | POA: Diagnosis not present

## 2020-04-04 ENCOUNTER — Telehealth (HOSPITAL_COMMUNITY): Payer: Self-pay | Admitting: *Deleted

## 2020-04-04 NOTE — Telephone Encounter (Signed)
Reaching out to patient to offer assistance regarding upcoming cardiac imaging study; pt verbalizes understanding of appt date/time, parking situation and where to check in, pre-test NPO status and medications ordered, and verified current allergies; name and call back number provided for further questions should they arise ° °Jamarien Rodkey Tai RN Navigator Cardiac Imaging °[facility] °[phone] office °[phone] cell ° °

## 2020-04-05 ENCOUNTER — Ambulatory Visit (HOSPITAL_BASED_OUTPATIENT_CLINIC_OR_DEPARTMENT_OTHER): Payer: PPO

## 2020-04-05 ENCOUNTER — Other Ambulatory Visit: Payer: Self-pay

## 2020-04-05 DIAGNOSIS — R079 Chest pain, unspecified: Secondary | ICD-10-CM | POA: Diagnosis not present

## 2020-04-05 DIAGNOSIS — I700 Atherosclerosis of aorta: Secondary | ICD-10-CM | POA: Diagnosis not present

## 2020-04-05 DIAGNOSIS — R011 Cardiac murmur, unspecified: Secondary | ICD-10-CM

## 2020-04-05 DIAGNOSIS — I2510 Atherosclerotic heart disease of native coronary artery without angina pectoris: Secondary | ICD-10-CM | POA: Diagnosis not present

## 2020-04-06 ENCOUNTER — Ambulatory Visit (HOSPITAL_COMMUNITY)
Admission: RE | Admit: 2020-04-06 | Discharge: 2020-04-06 | Disposition: A | Discharge status: Home / Self Care | Payer: PPO | Source: Ambulatory Visit | Attending: Cardiology | Admitting: Cardiology

## 2020-04-06 DIAGNOSIS — I2510 Atherosclerotic heart disease of native coronary artery without angina pectoris: Secondary | ICD-10-CM | POA: Insufficient documentation

## 2020-04-06 DIAGNOSIS — R079 Chest pain, unspecified: Secondary | ICD-10-CM | POA: Diagnosis not present

## 2020-04-06 DIAGNOSIS — I700 Atherosclerosis of aorta: Secondary | ICD-10-CM | POA: Insufficient documentation

## 2020-04-06 DIAGNOSIS — R011 Cardiac murmur, unspecified: Secondary | ICD-10-CM | POA: Insufficient documentation

## 2020-04-06 MED ORDER — NITROGLYCERIN 0.4 MG SL SUBL
SUBLINGUAL_TABLET | SUBLINGUAL | Status: AC
  Filled 2020-04-06: qty 2

## 2020-04-06 MED ORDER — NITROGLYCERIN 0.4 MG SL SUBL
0.8000 mg | SUBLINGUAL_TABLET | Freq: Once | SUBLINGUAL | Status: AC
  Administered 2020-04-06: 0.8 mg via SUBLINGUAL

## 2020-04-06 MED ORDER — IOHEXOL 350 MG/ML SOLN
80.0000 mL | Freq: Once | INTRAVENOUS | Status: AC | PRN
  Administered 2020-04-06: 80 mL via INTRAVENOUS

## 2020-04-07 DIAGNOSIS — I2510 Atherosclerotic heart disease of native coronary artery without angina pectoris: Secondary | ICD-10-CM | POA: Diagnosis not present

## 2020-04-09 DIAGNOSIS — M5136 Other intervertebral disc degeneration, lumbar region: Secondary | ICD-10-CM | POA: Diagnosis not present

## 2020-04-09 DIAGNOSIS — M5134 Other intervertebral disc degeneration, thoracic region: Secondary | ICD-10-CM | POA: Diagnosis not present

## 2020-04-09 DIAGNOSIS — M9900 Segmental and somatic dysfunction of head region: Secondary | ICD-10-CM | POA: Diagnosis not present

## 2020-04-09 DIAGNOSIS — M9901 Segmental and somatic dysfunction of cervical region: Secondary | ICD-10-CM | POA: Diagnosis not present

## 2020-04-09 DIAGNOSIS — M9902 Segmental and somatic dysfunction of thoracic region: Secondary | ICD-10-CM | POA: Diagnosis not present

## 2020-04-09 DIAGNOSIS — M5032 Other cervical disc degeneration, mid-cervical region, unspecified level: Secondary | ICD-10-CM | POA: Diagnosis not present

## 2020-04-10 ENCOUNTER — Other Ambulatory Visit: Payer: Self-pay | Admitting: *Deleted

## 2020-04-10 DIAGNOSIS — I350 Nonrheumatic aortic (valve) stenosis: Secondary | ICD-10-CM

## 2020-04-10 DIAGNOSIS — I341 Nonrheumatic mitral (valve) prolapse: Secondary | ICD-10-CM

## 2020-04-10 MED ORDER — ROSUVASTATIN CALCIUM 10 MG PO TABS
10.0000 mg | ORAL_TABLET | Freq: Every day | ORAL | 3 refills | Status: DC
Start: 2020-04-10 — End: 2020-05-29

## 2020-04-11 DIAGNOSIS — R002 Palpitations: Secondary | ICD-10-CM | POA: Diagnosis not present

## 2020-04-23 DIAGNOSIS — M5134 Other intervertebral disc degeneration, thoracic region: Secondary | ICD-10-CM | POA: Diagnosis not present

## 2020-04-23 DIAGNOSIS — M9901 Segmental and somatic dysfunction of cervical region: Secondary | ICD-10-CM | POA: Diagnosis not present

## 2020-04-23 DIAGNOSIS — M5136 Other intervertebral disc degeneration, lumbar region: Secondary | ICD-10-CM | POA: Diagnosis not present

## 2020-04-23 DIAGNOSIS — M5032 Other cervical disc degeneration, mid-cervical region, unspecified level: Secondary | ICD-10-CM | POA: Diagnosis not present

## 2020-04-23 DIAGNOSIS — M9900 Segmental and somatic dysfunction of head region: Secondary | ICD-10-CM | POA: Diagnosis not present

## 2020-04-23 DIAGNOSIS — M9902 Segmental and somatic dysfunction of thoracic region: Secondary | ICD-10-CM | POA: Diagnosis not present

## 2020-04-25 DIAGNOSIS — K219 Gastro-esophageal reflux disease without esophagitis: Secondary | ICD-10-CM | POA: Diagnosis not present

## 2020-04-25 DIAGNOSIS — Z800 Family history of malignant neoplasm of digestive organs: Secondary | ICD-10-CM | POA: Diagnosis not present

## 2020-05-15 DIAGNOSIS — R0789 Other chest pain: Secondary | ICD-10-CM | POA: Diagnosis not present

## 2020-05-15 DIAGNOSIS — E782 Mixed hyperlipidemia: Secondary | ICD-10-CM | POA: Diagnosis not present

## 2020-05-15 DIAGNOSIS — R7309 Other abnormal glucose: Secondary | ICD-10-CM | POA: Diagnosis not present

## 2020-05-15 DIAGNOSIS — R7303 Prediabetes: Secondary | ICD-10-CM | POA: Diagnosis not present

## 2020-05-16 ENCOUNTER — Encounter: Payer: Self-pay | Admitting: Cardiology

## 2020-05-16 ENCOUNTER — Ambulatory Visit: Payer: PPO | Admitting: Cardiology

## 2020-05-16 ENCOUNTER — Other Ambulatory Visit: Payer: Self-pay

## 2020-05-16 VITALS — BP 124/66 | HR 72 | Ht 59.0 in | Wt 128.4 lb

## 2020-05-16 DIAGNOSIS — I350 Nonrheumatic aortic (valve) stenosis: Secondary | ICD-10-CM

## 2020-05-16 DIAGNOSIS — I340 Nonrheumatic mitral (valve) insufficiency: Secondary | ICD-10-CM | POA: Diagnosis not present

## 2020-05-16 DIAGNOSIS — R002 Palpitations: Secondary | ICD-10-CM | POA: Diagnosis not present

## 2020-05-16 DIAGNOSIS — E785 Hyperlipidemia, unspecified: Secondary | ICD-10-CM | POA: Diagnosis not present

## 2020-05-16 DIAGNOSIS — I2510 Atherosclerotic heart disease of native coronary artery without angina pectoris: Secondary | ICD-10-CM | POA: Diagnosis not present

## 2020-05-16 NOTE — Patient Instructions (Signed)
Medication Instructions:  STOP rosuvastatin (Crestor) for 5 days --call us in 5 days to let us know if your knee pain has improved.  *If you need a refill on your cardiac medications before your next appointment, please call your pharmacy*  Follow-Up: At [facility], you and your health needs are our priority.  As part of our continuing mission to provide you with exceptional heart care, we have created designated Provider Care Teams.  These Care Teams include your primary Cardiologist (physician) and Advanced Practice Providers (APPs -  Physician Assistants and Nurse Practitioners) who all work together to provide you with the care you need, when you need it.  We recommend signing up for the patient portal called "MyChart".  Sign up information is provided on this After Visit Summary.  MyChart is used to connect with patients for Virtual Visits (Telemedicine).  Patients are able to view lab/test results, encounter notes, upcoming appointments, etc.  Non-urgent messages can be sent to your provider as well.   To learn more about what you can do with MyChart, go to [email or web address].    Your next appointment:   6 month(s)  The format for your next appointment:   In Person  Provider:   Oswaldo Milian, MD

## 2020-05-16 NOTE — Progress Notes (Signed)
Cardiology Office Note:    Date:  05/19/2020   ID:  Taylor Jacobs, DOB December 20, 1934, MRN [id number]  PCP:  Mayra Neer, MD  Cardiologist:  No primary care provider on file.  Electrophysiologist:  None   Referring MD: Mayra Neer, MD   Chief Complaint  Patient presents with  . Chest Pain    History of Present Illness:    Taylor Jacobs is a 85 y.o. female with a hx of GERD, reported MVP, hiatal hernia, hyperlipidemia, prediabetes who presents for follow-up.  She was referred by Lennie Odor, PA for an evaluation of chest pain, initially seen on 03/14/2020.  She had ED visit with chest pain on 11/08/2019.  She reported that she had been having constant burning pain which she described as being similar to what she gets with GERD.  No relationship with exertion.  Work-up in the ED was negative, including EKG without ischemic changes, unremarkable chest x-ray, and troponins negative x2.  Patient was recommended to follow-up with GI specialist.  She reports that she has been having chest pain for over 6 months.  Described as pressure in the center of her chest that occurs daily.  Usually occurs at night.  States that typically occurs while she is resting, can last for hours.  Prior to the pandemic she was walking 30 minutes/day, but has not been exercising regularly anymore.  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  She reports dyspnea with walking up stairs but denies any chest pain.  She is also having episodes of palpitations that occur every night.  States that feels like heart is racing during episodes, can last for hours.  Mother had aortic aneurysm, had surgery at age 67.  Son has pacemaker, she is unsure of reason.  Never smoked.  Coronary CTA was done 04/06/2020, which showed calcium score 0, nonobstructive CAD with noncalcified plaque in proximal RCA causing 50 to 69% stenosis (CT FFR 0.92).  Echocardiogram on 04/05/2020 shows normal biventricular  function, mild to moderate MR and mild AS. Zio patch x3 days on 04/12/2020 showed episodes of SVT, longest lasting 19 beats.  Since last clinic visit, she reports that she has been doing well.  States that her chest pain has improved, has not had any chest pressure recently.  She continues to have palpitations about once per week where she feels like her heart is racing.  Can last from seconds to minutes.  Also reports he has been having knee pain.  She started walking at least 30 minutes/day.  She denies any exertional chest pain or dyspnea.  Past Medical History:  Diagnosis Date  . GERD (gastroesophageal reflux disease)   . Headache   . Heart murmur     Past Surgical History:  Procedure Laterality Date  . ABDOMINAL HYSTERECTOMY     partial  . CESAREAN SECTION     x 3  . COLONOSCOPY WITH PROPOFOL N/A 12/04/2015   Procedure: COLONOSCOPY WITH PROPOFOL;  Surgeon: Garlan Fair, MD;  Location: [facility];  Service: Endoscopy;  Laterality: N/A;  . ESOPHAGOGASTRODUODENOSCOPY (EGD) WITH PROPOFOL N/A 12/04/2015   Procedure: ESOPHAGOGASTRODUODENOSCOPY (EGD) WITH PROPOFOL;  Surgeon: Garlan Fair, MD;  Location: [facility];  Service: Endoscopy;  Laterality: N/A;  . EYE SURGERY     bilateral cataract     Current Medications: Current Meds  Medication Sig  . acetaminophen (TYLENOL) 500 MG tablet Take 1,000 mg by mouth every 6 (six) hours as  needed (For headache, pain or fever.).  Marland Kitchen Cholecalciferol (VITAMIN D3) 2000 units TABS Take 2,000 Units by mouth daily.  Marland Kitchen dexlansoprazole (DEXILANT) 60 MG capsule Take 60 mg by mouth daily.  . rosuvastatin (CRESTOR) 10 MG tablet Take 1 tablet (10 mg total) by mouth daily.  . vitamin C (ASCORBIC ACID) 500 MG tablet Take 1,000 mg by mouth 2 (two) times daily.     Allergies:   Betadine [povidone iodine] and Sulfa antibiotics   Social History   Socioeconomic History  . Marital status: Married    Spouse name: Not on file  . Number of children: Not  on file  . Years of education: Not on file  . Highest education level: Not on file  Occupational History  . Not on file  Tobacco Use  . Smoking status: Never Smoker  . Smokeless tobacco: Never Used  Substance and Sexual Activity  . Alcohol use: No  . Drug use: No  . Sexual activity: Not on file  Other Topics Concern  . Not on file  Social History Narrative  . Not on file   Social Determinants of Health   Financial Resource Strain:   . Difficulty of Paying Living Expenses:   Food Insecurity:   . Worried About Charity fundraiser in the Last Year:   . Arboriculturist in the Last Year:   Transportation Needs:   . Film/video editor (Medical):   Marland Kitchen Lack of Transportation (Non-Medical):   Physical Activity:   . Days of Exercise per Week:   . Minutes of Exercise per Session:   Stress:   . Feeling of Stress :   Social Connections:   . Frequency of Communication with Friends and Family:   . Frequency of Social Gatherings with Friends and Family:   . Attends Religious Services:   . Active Member of Clubs or Organizations:   . Attends Archivist Meetings:   Marland Kitchen Marital Status:      Family History: Mother had aortic aneurysm, had surgery at age 68.  Son has pacemaker, she is unsure of reason.  ROS:   Please see the history of present illness.    All other systems reviewed and are negative.  EKGs/Labs/Other Studies Reviewed:    The following studies were reviewed today:   EKG:  EKG is not ordered today.  The ekg ordered 02/27/20 demonstrates normal sinus rhythm, rate 69, no ST/T abnormalities, left axis deviation, poor R wave progression  Recent Labs: 11/08/2019: Hemoglobin 13.5; Platelets 279 03/14/2020: BUN 16; Creatinine, Ser 0.63; Potassium 4.7; Sodium 137  Recent Lipid Panel No results found for: CHOL, TRIG, HDL, CHOLHDL, VLDL, LDLCALC, LDLDIRECT  Physical Exam:    VS:  BP 124/66   Pulse 72   Ht 4\' 11"  (1.499 m)   Wt 128 lb 6.4 oz (58.2 kg)   SpO2  98%   BMI 25.93 kg/m     Wt Readings from Last 3 Encounters:  05/16/20 128 lb 6.4 oz (58.2 kg)  03/14/20 126 lb (57.2 kg)  11/08/19 125 lb (56.7 kg)     GEN: in no acute distress HEENT: Normal NECK: No JVD; No carotid bruits LYMPHATICS: No lymphadenopathy CARDIAC: RRR, 2 out of 6 systolic murmur RESPIRATORY:  Clear to auscultation without rales, wheezing or rhonchi  ABDOMEN: Soft, non-tender, non-distended MUSCULOSKELETAL:  No edema; SKIN: Warm and dry NEUROLOGIC:  Alert and oriented x 3 PSYCHIATRIC:  Normal affect   ASSESSMENT:    1. Coronary artery  disease involving native coronary artery of native heart without angina pectoris   2. Aortic valve stenosis, etiology of cardiac valve disease unspecified   3. Palpitations   4. Hyperlipidemia, unspecified hyperlipidemia type   5. Mitral valve insufficiency, unspecified etiology    PLAN:     CAD: Reported atypical chest pain.  Coronary CTA was done 04/06/2020, which showed calcium score 0, nonobstructive CAD with noncalcified plaque in proximal RCA causing 50 to 69% stenosis (CT FFR 0.92). -Continue rosuvastatin 10 mg daily.  Mitral regurgitation: Mild to moderate MR on echo on 04/05/2020.  Will repeat echo in 1 year  Aortic stenosis: Mild AS on echo on 04/05/2020.  Palpitations: Zio patch x3 days showed short episodes of SVT, longest lasting 19 beats.  She appeared asymptomatic during these episodes  Hyperlipidemia: Lipid panel showed LDL 185 on 11/20/2019.  Started rosuvastatin 10 mg daily on 04/06/2020.  She reports that she has been having knee pain, unclear if related to statin use or not.  Advised to hold statin x5 days and call us to see if any improvement in her symptoms.  If no improvement, will restart rosuvastatin.  If symptoms are improved, will switch to alternative statin.  RTC in 6 months  Medication Adjustments/Labs and Tests Ordered: Current medicines are reviewed at length with the patient today.  Concerns  regarding medicines are outlined above.  No orders of the defined types were placed in this encounter.  No orders of the defined types were placed in this encounter.   Patient Instructions  Medication Instructions:  STOP rosuvastatin (Crestor) for 5 days --call us in 5 days to let us know if your knee pain has improved.  *If you need a refill on your cardiac medications before your next appointment, please call your pharmacy*  Follow-Up: At [facility], you and your health needs are our priority.  As part of our continuing mission to provide you with exceptional heart care, we have created designated Provider Care Teams.  These Care Teams include your primary Cardiologist (physician) and Advanced Practice Providers (APPs -  Physician Assistants and Nurse Practitioners) who all work together to provide you with the care you need, when you need it.  We recommend signing up for the patient portal called "MyChart".  Sign up information is provided on this After Visit Summary.  MyChart is used to connect with patients for Virtual Visits (Telemedicine).  Patients are able to view lab/test results, encounter notes, upcoming appointments, etc.  Non-urgent messages can be sent to your provider as well.   To learn more about what you can do with MyChart, go to [email or web address].    Your next appointment:   6 month(s)  The format for your next appointment:   In Person  Provider:   Oswaldo Milian, MD        Signed, Donato Heinz, MD  05/19/2020 11:46 AM    [place]

## 2020-05-28 ENCOUNTER — Telehealth: Payer: Self-pay | Admitting: Cardiology

## 2020-05-28 NOTE — Telephone Encounter (Signed)
Recommend stopping rosuvastatin.  Recommend instead trying atorvastatin 10 mg daily

## 2020-05-28 NOTE — Telephone Encounter (Signed)
Patient stopped the medication for 5 days, and since then restarted and the aching as worsened.  Will send to MD to advise.  Thanks!

## 2020-05-28 NOTE — Telephone Encounter (Signed)
Pt c/o medication issue:  1. Name of Medication:  Rosuvastatin  2. How are you currently taking this medication (dosage and times per day)?  1  Per day 3. Are you having a reaction (difficulty breathing--STAT)?   4. What is your medication issue? Her legs are aching so bad- she can not take it

## 2020-05-29 ENCOUNTER — Other Ambulatory Visit: Payer: Self-pay

## 2020-05-29 MED ORDER — ATORVASTATIN CALCIUM 10 MG PO TABS
10.0000 mg | ORAL_TABLET | Freq: Every day | ORAL | 3 refills | Status: DC
Start: 2020-05-29 — End: 2020-11-19

## 2020-05-29 NOTE — Telephone Encounter (Signed)
Called patient- advised of message below- She has since stopped the rosuvastatin for the last few days, and her legs are still aching some- but she does want to try the new medication.   Patient verbalized understanding.

## 2020-06-18 DIAGNOSIS — M9902 Segmental and somatic dysfunction of thoracic region: Secondary | ICD-10-CM | POA: Diagnosis not present

## 2020-06-18 DIAGNOSIS — M5136 Other intervertebral disc degeneration, lumbar region: Secondary | ICD-10-CM | POA: Diagnosis not present

## 2020-06-18 DIAGNOSIS — M9900 Segmental and somatic dysfunction of head region: Secondary | ICD-10-CM | POA: Diagnosis not present

## 2020-06-18 DIAGNOSIS — M5134 Other intervertebral disc degeneration, thoracic region: Secondary | ICD-10-CM | POA: Diagnosis not present

## 2020-06-18 DIAGNOSIS — M5032 Other cervical disc degeneration, mid-cervical region, unspecified level: Secondary | ICD-10-CM | POA: Diagnosis not present

## 2020-06-18 DIAGNOSIS — M9901 Segmental and somatic dysfunction of cervical region: Secondary | ICD-10-CM | POA: Diagnosis not present

## 2020-06-19 DIAGNOSIS — M25561 Pain in right knee: Secondary | ICD-10-CM | POA: Diagnosis not present

## 2020-08-01 DIAGNOSIS — Z23 Encounter for immunization: Secondary | ICD-10-CM | POA: Diagnosis not present

## 2020-09-03 DIAGNOSIS — M85852 Other specified disorders of bone density and structure, left thigh: Secondary | ICD-10-CM | POA: Diagnosis not present

## 2020-09-03 DIAGNOSIS — Z780 Asymptomatic menopausal state: Secondary | ICD-10-CM | POA: Diagnosis not present

## 2020-09-03 DIAGNOSIS — M85851 Other specified disorders of bone density and structure, right thigh: Secondary | ICD-10-CM | POA: Diagnosis not present

## 2020-09-03 DIAGNOSIS — Z1231 Encounter for screening mammogram for malignant neoplasm of breast: Secondary | ICD-10-CM | POA: Diagnosis not present

## 2020-09-03 DIAGNOSIS — M85832 Other specified disorders of bone density and structure, left forearm: Secondary | ICD-10-CM | POA: Diagnosis not present

## 2020-10-30 ENCOUNTER — Other Ambulatory Visit: Payer: Self-pay | Admitting: Family Medicine

## 2020-10-30 ENCOUNTER — Ambulatory Visit
Admission: RE | Admit: 2020-10-30 | Discharge: 2020-10-30 | Disposition: A | Discharge status: Home / Self Care | Payer: PPO | Source: Ambulatory Visit | Attending: Family Medicine | Admitting: Family Medicine

## 2020-10-30 DIAGNOSIS — M11232 Other chondrocalcinosis, left wrist: Secondary | ICD-10-CM | POA: Diagnosis not present

## 2020-10-30 DIAGNOSIS — M25532 Pain in left wrist: Secondary | ICD-10-CM | POA: Diagnosis not present

## 2020-10-30 DIAGNOSIS — M1812 Unilateral primary osteoarthritis of first carpometacarpal joint, left hand: Secondary | ICD-10-CM | POA: Diagnosis not present

## 2020-10-30 DIAGNOSIS — R52 Pain, unspecified: Secondary | ICD-10-CM

## 2020-10-30 DIAGNOSIS — M19032 Primary osteoarthritis, left wrist: Secondary | ICD-10-CM | POA: Diagnosis not present

## 2020-10-30 DIAGNOSIS — M11242 Other chondrocalcinosis, left hand: Secondary | ICD-10-CM | POA: Diagnosis not present

## 2020-10-30 DIAGNOSIS — M19042 Primary osteoarthritis, left hand: Secondary | ICD-10-CM | POA: Diagnosis not present

## 2020-10-30 DIAGNOSIS — M79642 Pain in left hand: Secondary | ICD-10-CM | POA: Diagnosis not present

## 2020-11-06 DIAGNOSIS — M25532 Pain in left wrist: Secondary | ICD-10-CM | POA: Diagnosis not present

## 2020-11-17 NOTE — Progress Notes (Signed)
Cardiology Office Note:    Date:  11/19/2020   ID:  Taylor Jacobs, DOB 20-Aug-1935, MRN [id number]  PCP:  Mayra Neer, MD  Cardiologist:  No primary care provider on file.  Electrophysiologist:  None   Referring MD: Mayra Neer, MD   Chief Complaint  Patient presents with  . Palpitations    History of Present Illness:    Taylor Jacobs is a 86 y.o. female with a hx of GERD, reported MVP, hiatal hernia, hyperlipidemia, prediabetes who presents for follow-up.  She was referred by Lennie Odor, PA for an evaluation of chest pain, initially seen on 03/14/2020.  She had ED visit with chest pain on 11/08/2019.  She reported that she had been having constant burning pain which she described as being similar to what she gets with GERD.  No relationship with exertion.  Work-up in the ED was negative, including EKG without ischemic changes, unremarkable chest x-ray, and troponins negative x2.  Patient was recommended to follow-up with GI specialist.  She reports that she has been having chest pain for over 6 months prior.  Described as pressure in the center of her chest that occurs daily.  Usually occurs at night.  States that typically occurs while she is resting, can last for hours.  Prior to the pandemic she was walking 30 minutes/day, but has not been exercising regularly anymore.  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  She reports dyspnea with walking up stairs but denies any chest pain.  She is also having episodes of palpitations that occur every night.  States that feels like heart is racing during episodes, can last for hours.  Mother had aortic aneurysm, had surgery at age 27.  Son has pacemaker, she is unsure of reason.  Never smoked.  Coronary CTA was done 04/06/2020, which showed calcium score 0, nonobstructive CAD with noncalcified plaque in proximal RCA causing 50 to 69% stenosis (CT FFR 0.92).  Echocardiogram on 04/05/2020 shows normal biventricular  function, mild to moderate MR and mild AS. Zio patch x3 days on 04/12/2020 showed episodes of SVT, longest lasting 19 beats.  Since last clinic visit, she reports that she is doing okay.  She denies any recent chest pain.  Does report she has been having some dyspnea.  Reports she continues to have palpitations, occurs about 2-3 times per week.  Recently had an episode that lasted for hours.  Feels like heart is racing during episodes.  She reported leg pain on both atorvastatin and rosuvastatin, so stopped taking.  She denies any lightheadedness, syncope.  Does report occasional left ankle edema.  Reports she has not been exercising.  Past Medical History:  Diagnosis Date  . GERD (gastroesophageal reflux disease)   . Headache   . Heart murmur     Past Surgical History:  Procedure Laterality Date  . ABDOMINAL HYSTERECTOMY     partial  . CESAREAN SECTION     x 3  . COLONOSCOPY WITH PROPOFOL N/A 12/04/2015   Procedure: COLONOSCOPY WITH PROPOFOL;  Surgeon: Garlan Fair, MD;  Location: [facility];  Service: Endoscopy;  Laterality: N/A;  . ESOPHAGOGASTRODUODENOSCOPY (EGD) WITH PROPOFOL N/A 12/04/2015   Procedure: ESOPHAGOGASTRODUODENOSCOPY (EGD) WITH PROPOFOL;  Surgeon: Garlan Fair, MD;  Location: [facility];  Service: Endoscopy;  Laterality: N/A;  . EYE SURGERY     bilateral cataract     Current Medications: Current Meds  Medication Sig  . acetaminophen (TYLENOL) 500  MG tablet Take 1,000 mg by mouth every 6 (six) hours as needed (For headache, pain or fever.).  Marland Kitchen Cholecalciferol (VITAMIN D3) 2000 units TABS Take 2,000 Units by mouth daily.  Marland Kitchen dexlansoprazole (DEXILANT) 60 MG capsule Take 60 mg by mouth daily.  . vitamin C (ASCORBIC ACID) 500 MG tablet Take 1,000 mg by mouth 2 (two) times daily.  . [DISCONTINUED] pravastatin (PRAVACHOL) 20 MG tablet Take 1 tablet (20 mg total) by mouth every evening.     Allergies:   Atorvastatin, Betadine [povidone iodine], and Sulfa  antibiotics   Social History   Socioeconomic History  . Marital status: Married    Spouse name: Not on file  . Number of children: Not on file  . Years of education: Not on file  . Highest education level: Not on file  Occupational History  . Not on file  Tobacco Use  . Smoking status: Never Smoker  . Smokeless tobacco: Never Used  Substance and Sexual Activity  . Alcohol use: No  . Drug use: No  . Sexual activity: Not on file  Other Topics Concern  . Not on file  Social History Narrative  . Not on file   Social Determinants of Health   Financial Resource Strain: Not on file  Food Insecurity: Not on file  Transportation Needs: Not on file  Physical Activity: Not on file  Stress: Not on file  Social Connections: Not on file     Family History: Mother had aortic aneurysm, had surgery at age 32.  Son has pacemaker, she is unsure of reason.  ROS:   Please see the history of present illness.    All other systems reviewed and are negative.  EKGs/Labs/Other Studies Reviewed:    The following studies were reviewed today:   EKG:  EKG is not ordered today.  The ekg ordered 02/27/20 demonstrates normal sinus rhythm, rate 69, no ST/T abnormalities, left axis deviation, poor R wave progression  Recent Labs: 03/14/2020: BUN 16; Creatinine, Ser 0.63; Potassium 4.7; Sodium 137  Recent Lipid Panel No results found for: CHOL, TRIG, HDL, CHOLHDL, VLDL, LDLCALC, LDLDIRECT  Physical Exam:    VS:  BP 116/80   Pulse 73   Ht 4\' 11"  (1.499 m)   Wt 131 lb 12.8 oz (59.8 kg)   SpO2 95%   BMI 26.62 kg/m     Wt Readings from Last 3 Encounters:  11/19/20 131 lb 12.8 oz (59.8 kg)  05/16/20 128 lb 6.4 oz (58.2 kg)  03/14/20 126 lb (57.2 kg)     GEN: in no acute distress HEENT: Normal NECK: No JVD; No carotid bruits LYMPHATICS: No lymphadenopathy CARDIAC: RRR, 2 out of 6 systolic murmur RESPIRATORY:  Clear to auscultation without rales, wheezing or rhonchi  ABDOMEN: Soft,  non-tender, non-distended MUSCULOSKELETAL:  No edema; SKIN: Warm and dry NEUROLOGIC:  Alert and oriented x 3 PSYCHIATRIC:  Normal affect   ASSESSMENT:    1. Coronary artery disease involving native coronary artery of native heart without angina pectoris   2. Aortic valve stenosis, etiology of cardiac valve disease unspecified   3. Mitral valve insufficiency, unspecified etiology   4. Palpitations   5. Hyperlipidemia, unspecified hyperlipidemia type    PLAN:    CAD: Reported atypical chest pain.  Coronary CTA was done 04/06/2020, which showed calcium score 0, nonobstructive CAD with noncalcified plaque in proximal RCA causing 50 to 69% stenosis (CT FFR 0.92). -Unable to tolerate atorvastatin or rosuvastatin, will try pravastatin 20 mg daily  Mitral regurgitation: Mild to moderate MR on echo on 04/05/2020.  Will repeat echo 03/2021 for monitoring  Aortic stenosis: Mild AS on echo on 04/05/2020.  Palpitations: Zio patch x3 days showed short episodes of SVT, longest lasting 19 beats.  She appeared asymptomatic during these episodes.  Reports worsening palpitations, recently had an episode of heart racing that lasted for hours.  Will check Zio patch x2 weeks  Hyperlipidemia: Lipid panel showed LDL 185 on 11/20/2019.  Started rosuvastatin 10 mg daily on 04/06/2020.  She states that she only took for about a week, was unable to tolerate.  Subsequently tried atorvastatin 10 mg daily but again had leg pain so discontinued.  LDL 112 on 05/15/2020.  Will try pravastatin 20 mg daily  RTC in 6 months  Medication Adjustments/Labs and Tests Ordered: Current medicines are reviewed at length with the patient today.  Concerns regarding medicines are outlined above.  Orders Placed This Encounter  Procedures  . LONG TERM MONITOR (3-14 DAYS)  . ECHOCARDIOGRAM COMPLETE   Meds ordered this encounter  Medications  . DISCONTD: pravastatin (PRAVACHOL) 20 MG tablet    Sig: Take 1 tablet (20 mg total) by mouth  every evening.    Dispense:  90 tablet    Refill:  3  . pravastatin (PRAVACHOL) 20 MG tablet    Sig: Take 1 tablet (20 mg total) by mouth every evening.    Dispense:  90 tablet    Refill:  3    Patient Instructions  Medication Instructions:  START pravastatin 20 mg daily --please call to let us know if you have any issues with muscle aches.  *If you need a refill on your cardiac medications before your next appointment, please call your pharmacy*  Testing/Procedures: Your physician has requested that you have an echocardiogram in 6 MONTHS. Echocardiography is a painless test that uses sound waves to create images of your heart. It provides your doctor with information about the size and shape of your heart and how well your heart's chambers and valves are working. This procedure takes approximately one hour. There are no restrictions for this procedure. This will be done at our [facility] location:  [place] has requested you wear your ZIO patch monitor 14 days.   This is a single patch monitor.  [employer] supplies one patch monitor per enrollment.  Additional stickers are not available.   Please do not apply patch if you will be having a Nuclear Stress Test, Echocardiogram, Cardiac CT, MRI, or Chest Xray during the time frame you would be wearing the monitor. The patch cannot be worn during these tests.  You cannot remove and re-apply the ZIO XT patch monitor.   Your ZIO patch monitor will be sent USPS Priority mail from [facility] directly to your home address. The monitor may also be mailed to a PO BOX if home delivery is not available.   It may take 3-5 days to receive your monitor after you have been enrolled.   Once you have received you monitor, please review enclosed instructions.  Your monitor has already been registered assigning a specific monitor serial # to you.   Applying the  monitor   Shave hair from upper left chest.   Hold abrader disc by orange tab.  Rub abrader in 40 strokes over left upper chest as indicated in your monitor instructions.   Clean area with 4 enclosed  alcohol pads .  Use all pads to assure are is cleaned thoroughly.  Let dry.   Apply patch as indicated in monitor instructions.  Patch will be place under collarbone on left side of chest with arrow pointing upward.   Rub patch adhesive wings for 2 minutes.Remove white label marked "1".  Remove white label marked "2".  Rub patch adhesive wings for 2 additional minutes.   While looking in a mirror, press and release button in center of patch.  A small green light will flash 3-4 times .  This will be your only indicator the monitor has been turned on.     Do not shower for the first 24 hours.  You may shower after the first 24 hours.   Press button if you feel a symptom. You will hear a small click.  Record Date, Time and Symptom in the Patient Log Book.   When you are ready to remove patch, follow instructions on last 2 pages of Patient Log Book.  Stick patch monitor onto last page of Patient Log Book.   Place Patient Log Book in [place] box.  Use locking tab on box and tape box closed securely.  The [employer] and [employer] has [employer] on it.  Please place in mailbox as soon as possible.  Your physician should have your test results approximately 7 days after the monitor has been mailed back to [facility].   Call [place] at [phone] if you have questions regarding your ZIO XT patch monitor.  Call them immediately if you see an orange light blinking on your monitor.   If your monitor falls off in less than 4 days contact our Monitor department at [phone].  If your monitor becomes loose or falls off after 4 days call [employer] at [phone] for suggestions on securing your monitor.     Follow-Up: At [facility], you and your health needs are our  priority.  As part of our continuing mission to provide you with exceptional heart care, we have created designated Provider Care Teams.  These Care Teams include your primary Cardiologist (physician) and Advanced Practice Providers (APPs -  Physician Assistants and Nurse Practitioners) who all work together to provide you with the care you need, when you need it.  We recommend signing up for the patient portal called "MyChart".  Sign up information is provided on this After Visit Summary.  MyChart is used to connect with patients for Virtual Visits (Telemedicine).  Patients are able to view lab/test results, encounter notes, upcoming appointments, etc.  Non-urgent messages can be sent to your provider as well.   To learn more about what you can do with MyChart, go to [email or web address].    Your next appointment:   6 month(s)  The format for your next appointment:   In Person  Provider:   Oswaldo Milian, MD      Signed, Donato Heinz, MD  11/19/2020 6:08 PM    [place]

## 2020-11-19 ENCOUNTER — Ambulatory Visit (INDEPENDENT_AMBULATORY_CARE_PROVIDER_SITE_OTHER): Payer: PPO

## 2020-11-19 ENCOUNTER — Encounter: Payer: Self-pay | Admitting: *Deleted

## 2020-11-19 ENCOUNTER — Encounter: Payer: Self-pay | Admitting: Cardiology

## 2020-11-19 ENCOUNTER — Other Ambulatory Visit: Payer: Self-pay

## 2020-11-19 ENCOUNTER — Ambulatory Visit: Payer: PPO | Admitting: Cardiology

## 2020-11-19 VITALS — BP 116/80 | HR 73 | Ht 59.0 in | Wt 131.8 lb

## 2020-11-19 DIAGNOSIS — I350 Nonrheumatic aortic (valve) stenosis: Secondary | ICD-10-CM | POA: Diagnosis not present

## 2020-11-19 DIAGNOSIS — I2510 Atherosclerotic heart disease of native coronary artery without angina pectoris: Secondary | ICD-10-CM

## 2020-11-19 DIAGNOSIS — E785 Hyperlipidemia, unspecified: Secondary | ICD-10-CM

## 2020-11-19 DIAGNOSIS — I340 Nonrheumatic mitral (valve) insufficiency: Secondary | ICD-10-CM | POA: Diagnosis not present

## 2020-11-19 DIAGNOSIS — R002 Palpitations: Secondary | ICD-10-CM

## 2020-11-19 MED ORDER — PRAVASTATIN SODIUM 20 MG PO TABS
20.0000 mg | ORAL_TABLET | Freq: Every evening | ORAL | 3 refills | Status: DC
Start: 2020-11-19 — End: 2023-02-19

## 2020-11-19 MED ORDER — PRAVASTATIN SODIUM 20 MG PO TABS
20.0000 mg | ORAL_TABLET | Freq: Every evening | ORAL | 3 refills | Status: DC
Start: 2020-11-19 — End: 2020-11-19

## 2020-11-19 NOTE — Progress Notes (Signed)
Patient ID: Taylor Jacobs, female   DOB: [birth year], 86 y.o.   MRN: [id number] Patient enrolled for [employer] to ship a 14 day ZIO XT long term holter monitor to her home.

## 2020-11-19 NOTE — Patient Instructions (Signed)
Medication Instructions:  START pravastatin 20 mg daily --please call to let us know if you have any issues with muscle aches.  *If you need a refill on your cardiac medications before your next appointment, please call your pharmacy*  Testing/Procedures: Your physician has requested that you have an echocardiogram in 6 MONTHS. Echocardiography is a painless test that uses sound waves to create images of your heart. It provides your doctor with information about the size and shape of your heart and how well your heart's chambers and valves are working. This procedure takes approximately one hour. There are no restrictions for this procedure. This will be done at our [facility] location:  [place] has requested you wear your ZIO patch monitor 14 days.   This is a single patch monitor.  [employer] supplies one patch monitor per enrollment.  Additional stickers are not available.   Please do not apply patch if you will be having a Nuclear Stress Test, Echocardiogram, Cardiac CT, MRI, or Chest Xray during the time frame you would be wearing the monitor. The patch cannot be worn during these tests.  You cannot remove and re-apply the ZIO XT patch monitor.   Your ZIO patch monitor will be sent USPS Priority mail from [facility] [employer] [facility] directly to your home address. The monitor may also be mailed to a PO BOX if home delivery is not available.   It may take 3-5 days to receive your monitor after you have been enrolled.   Once you have received you monitor, please review enclosed instructions.  Your monitor has already been registered assigning a specific monitor serial # to you.   Applying the monitor   Shave hair from upper left chest.   Hold abrader disc by orange tab.  Rub abrader in 40 strokes over left upper chest as indicated in your monitor instructions.   Clean area with 4 enclosed alcohol pads .  Use all  pads to assure are is cleaned thoroughly.  Let dry.   Apply patch as indicated in monitor instructions.  Patch will be place under collarbone on left side of chest with arrow pointing upward.   Rub patch adhesive wings for 2 minutes.Remove white label marked "1".  Remove white label marked "2".  Rub patch adhesive wings for 2 additional minutes.   While looking in a mirror, press and release button in center of patch.  A small green light will flash 3-4 times .  This will be your only indicator the monitor has been turned on.     Do not shower for the first 24 hours.  You may shower after the first 24 hours.   Press button if you feel a symptom. You will hear a small click.  Record Date, Time and Symptom in the Patient Log Book.   When you are ready to remove patch, follow instructions on last 2 pages of Patient Log Book.  Stick patch monitor onto last page of Patient Log Book.   Place Patient Log Book in [place] box.  Use locking tab on box and tape box closed securely.  The [employer] has [employer] on it.  Please place in mailbox as soon as possible.  Your physician should have your test results approximately 7 days after the monitor has been mailed back to [facility].   Call [place] at [phone] if you have questions regarding  your ZIO XT patch monitor.  Call them immediately if you see an orange light blinking on your monitor.   If your monitor falls off in less than 4 days contact our Monitor department at [phone].  If your monitor becomes loose or falls off after 4 days call [employer] at [phone] for suggestions on securing your monitor.     Follow-Up: At [facility], you and your health needs are our priority.  As part of our continuing mission to provide you with exceptional heart care, we have created designated Provider Care Teams.  These Care Teams include your primary Cardiologist (physician) and Advanced Practice Providers  (APPs -  Physician Assistants and Nurse Practitioners) who all work together to provide you with the care you need, when you need it.  We recommend signing up for the patient portal called "MyChart".  Sign up information is provided on this After Visit Summary.  MyChart is used to connect with patients for Virtual Visits (Telemedicine).  Patients are able to view lab/test results, encounter notes, upcoming appointments, etc.  Non-urgent messages can be sent to your provider as well.   To learn more about what you can do with MyChart, go to [email or web address].    Your next appointment:   6 month(s)  The format for your next appointment:   In Person  Provider:   Oswaldo Milian, MD

## 2020-11-20 DIAGNOSIS — M25532 Pain in left wrist: Secondary | ICD-10-CM | POA: Diagnosis not present

## 2020-12-03 DIAGNOSIS — H40013 Open angle with borderline findings, low risk, bilateral: Secondary | ICD-10-CM | POA: Diagnosis not present

## 2020-12-03 DIAGNOSIS — H353131 Nonexudative age-related macular degeneration, bilateral, early dry stage: Secondary | ICD-10-CM | POA: Diagnosis not present

## 2020-12-03 DIAGNOSIS — H16223 Keratoconjunctivitis sicca, not specified as Sjogren's, bilateral: Secondary | ICD-10-CM | POA: Diagnosis not present

## 2020-12-03 DIAGNOSIS — H5213 Myopia, bilateral: Secondary | ICD-10-CM | POA: Diagnosis not present

## 2020-12-13 DIAGNOSIS — R002 Palpitations: Secondary | ICD-10-CM | POA: Diagnosis not present

## 2021-01-09 DIAGNOSIS — M5136 Other intervertebral disc degeneration, lumbar region: Secondary | ICD-10-CM | POA: Diagnosis not present

## 2021-01-09 DIAGNOSIS — M9902 Segmental and somatic dysfunction of thoracic region: Secondary | ICD-10-CM | POA: Diagnosis not present

## 2021-01-09 DIAGNOSIS — M5134 Other intervertebral disc degeneration, thoracic region: Secondary | ICD-10-CM | POA: Diagnosis not present

## 2021-01-09 DIAGNOSIS — M9900 Segmental and somatic dysfunction of head region: Secondary | ICD-10-CM | POA: Diagnosis not present

## 2021-01-09 DIAGNOSIS — M5032 Other cervical disc degeneration, mid-cervical region, unspecified level: Secondary | ICD-10-CM | POA: Diagnosis not present

## 2021-01-09 DIAGNOSIS — M9901 Segmental and somatic dysfunction of cervical region: Secondary | ICD-10-CM | POA: Diagnosis not present

## 2021-01-15 DIAGNOSIS — M5136 Other intervertebral disc degeneration, lumbar region: Secondary | ICD-10-CM | POA: Diagnosis not present

## 2021-01-15 DIAGNOSIS — M5134 Other intervertebral disc degeneration, thoracic region: Secondary | ICD-10-CM | POA: Diagnosis not present

## 2021-01-15 DIAGNOSIS — M5032 Other cervical disc degeneration, mid-cervical region, unspecified level: Secondary | ICD-10-CM | POA: Diagnosis not present

## 2021-01-15 DIAGNOSIS — M9901 Segmental and somatic dysfunction of cervical region: Secondary | ICD-10-CM | POA: Diagnosis not present

## 2021-01-15 DIAGNOSIS — M9902 Segmental and somatic dysfunction of thoracic region: Secondary | ICD-10-CM | POA: Diagnosis not present

## 2021-01-15 DIAGNOSIS — M9900 Segmental and somatic dysfunction of head region: Secondary | ICD-10-CM | POA: Diagnosis not present

## 2021-01-22 DIAGNOSIS — M5459 Other low back pain: Secondary | ICD-10-CM | POA: Diagnosis not present

## 2021-01-22 DIAGNOSIS — K219 Gastro-esophageal reflux disease without esophagitis: Secondary | ICD-10-CM | POA: Diagnosis not present

## 2021-01-22 DIAGNOSIS — E559 Vitamin D deficiency, unspecified: Secondary | ICD-10-CM | POA: Diagnosis not present

## 2021-01-22 DIAGNOSIS — E782 Mixed hyperlipidemia: Secondary | ICD-10-CM | POA: Diagnosis not present

## 2021-01-22 DIAGNOSIS — N3281 Overactive bladder: Secondary | ICD-10-CM | POA: Diagnosis not present

## 2021-01-22 DIAGNOSIS — M150 Primary generalized (osteo)arthritis: Secondary | ICD-10-CM | POA: Diagnosis not present

## 2021-01-22 DIAGNOSIS — M8580 Other specified disorders of bone density and structure, unspecified site: Secondary | ICD-10-CM | POA: Diagnosis not present

## 2021-01-22 DIAGNOSIS — K449 Diaphragmatic hernia without obstruction or gangrene: Secondary | ICD-10-CM | POA: Diagnosis not present

## 2021-01-22 DIAGNOSIS — Z0000 Encounter for general adult medical examination without abnormal findings: Secondary | ICD-10-CM | POA: Diagnosis not present

## 2021-01-22 DIAGNOSIS — R7303 Prediabetes: Secondary | ICD-10-CM | POA: Diagnosis not present

## 2021-01-22 DIAGNOSIS — J42 Unspecified chronic bronchitis: Secondary | ICD-10-CM | POA: Diagnosis not present

## 2021-01-22 DIAGNOSIS — N393 Stress incontinence (female) (male): Secondary | ICD-10-CM | POA: Diagnosis not present

## 2021-02-15 DIAGNOSIS — K219 Gastro-esophageal reflux disease without esophagitis: Secondary | ICD-10-CM | POA: Diagnosis not present

## 2021-02-15 DIAGNOSIS — R059 Cough, unspecified: Secondary | ICD-10-CM | POA: Diagnosis not present

## 2021-05-16 ENCOUNTER — Ambulatory Visit (HOSPITAL_COMMUNITY): Payer: PPO | Attending: Cardiology

## 2021-05-16 ENCOUNTER — Other Ambulatory Visit: Payer: Self-pay

## 2021-05-16 DIAGNOSIS — I350 Nonrheumatic aortic (valve) stenosis: Secondary | ICD-10-CM | POA: Diagnosis not present

## 2021-05-16 DIAGNOSIS — I340 Nonrheumatic mitral (valve) insufficiency: Secondary | ICD-10-CM | POA: Insufficient documentation

## 2021-05-16 LAB — ECHOCARDIOGRAM COMPLETE
AR max vel: 1.6 cm2
AV Area VTI: 1.72 cm2
AV Area mean vel: 1.81 cm2
AV Mean grad: 13 mmHg
AV Peak grad: 22.7 mmHg
Ao pk vel: 2.38 m/s
Area-P 1/2: 2.33 cm2
S' Lateral: 2.5 cm

## 2021-06-03 DIAGNOSIS — H40013 Open angle with borderline findings, low risk, bilateral: Secondary | ICD-10-CM | POA: Diagnosis not present

## 2021-06-03 DIAGNOSIS — H353131 Nonexudative age-related macular degeneration, bilateral, early dry stage: Secondary | ICD-10-CM | POA: Diagnosis not present

## 2021-06-03 DIAGNOSIS — H16223 Keratoconjunctivitis sicca, not specified as Sjogren's, bilateral: Secondary | ICD-10-CM | POA: Diagnosis not present

## 2021-06-03 DIAGNOSIS — Z961 Presence of intraocular lens: Secondary | ICD-10-CM | POA: Diagnosis not present

## 2021-07-17 DIAGNOSIS — I2510 Atherosclerotic heart disease of native coronary artery without angina pectoris: Secondary | ICD-10-CM | POA: Diagnosis not present

## 2021-07-17 DIAGNOSIS — E782 Mixed hyperlipidemia: Secondary | ICD-10-CM | POA: Diagnosis not present

## 2021-07-17 DIAGNOSIS — R7303 Prediabetes: Secondary | ICD-10-CM | POA: Diagnosis not present

## 2021-07-17 DIAGNOSIS — R059 Cough, unspecified: Secondary | ICD-10-CM | POA: Diagnosis not present

## 2021-07-17 DIAGNOSIS — K219 Gastro-esophageal reflux disease without esophagitis: Secondary | ICD-10-CM | POA: Diagnosis not present

## 2021-08-12 ENCOUNTER — Other Ambulatory Visit: Payer: Self-pay | Admitting: Cardiology

## 2021-09-03 DIAGNOSIS — R059 Cough, unspecified: Secondary | ICD-10-CM | POA: Diagnosis not present

## 2021-09-03 DIAGNOSIS — U071 COVID-19: Secondary | ICD-10-CM | POA: Diagnosis not present

## 2021-09-03 DIAGNOSIS — E782 Mixed hyperlipidemia: Secondary | ICD-10-CM | POA: Diagnosis not present

## 2021-09-16 NOTE — Progress Notes (Addendum)
Cardiology Office Note:    Date:  09/18/2021   ID:  Taylor Jacobs, DOB 1935-03-20, MRN [id number]  PCP:  Taylor Neer, MD  Cardiologist:  Donato Heinz, MD   Referring MD: Taylor Neer, MD   Chief Complaint  Patient presents with   Follow-up    HLD    History of Present Illness:    Taylor Jacobs is a 86 y.o. female with a hx of GERD, reported MVP, hiatal hernia, hyperlipidemia, and prediabetes.  She established with cardiology for chest pain.  Coronary CTA on 04/06/2020 showed a calcium score of 0, nonobstructive CAD with noncalcified plaque in the proximal RCA, but FFR 0.92.  Echo at the same time shows normal biventricular function and mild to moderate MR and mild AS.  Zio patch x3 days on 04/12/2020 showed episodes of SVT longest episode lasting 19 beats.  She was seen by Dr. Gardiner Rhyme on 05/16/2020.  She was doing well at that time.  She was working as an Immunologist at [employer].  While she was no longer walking 30 minutes a day as she was pre-pandemic, she does walk up and down the aisles at the [facility].  She presents today for scheduled follow-up. She worked the [employer] information [employer] manager and thinks she got COVID there. Thankfully she did not require hospitalization. She took paxlovid and stopped her statin. She has not yet started back. Her leg pain is completely resolved off of statin. LDL was 142. She can take statin every other day to improve quality of life. She still works at [employer].   She also reports palpitations at night, sometimes for 1-1.5 hrs. these are worse after consuming caffeinated beverages that day.  We discussed vagal maneuvers.   Past Medical History:  Diagnosis Date   GERD (gastroesophageal reflux disease)    Headache    Heart murmur     Past Surgical History:  Procedure Laterality Date   ABDOMINAL HYSTERECTOMY     partial   CESAREAN SECTION     x 3   COLONOSCOPY WITH PROPOFOL N/A 12/04/2015   Procedure: COLONOSCOPY WITH PROPOFOL;  Surgeon:  Garlan Fair, MD;  Location: [facility];  Service: Endoscopy;  Laterality: N/A;   ESOPHAGOGASTRODUODENOSCOPY (EGD) WITH PROPOFOL N/A 12/04/2015   Procedure: ESOPHAGOGASTRODUODENOSCOPY (EGD) WITH PROPOFOL;  Surgeon: Garlan Fair, MD;  Location: [facility];  Service: Endoscopy;  Laterality: N/A;   EYE SURGERY     bilateral cataract     Current Medications: Current Meds  Medication Sig   acetaminophen (TYLENOL) 500 MG tablet Take 1,000 mg by mouth every 6 (six) hours as needed (For headache, pain or fever.).   albuterol (VENTOLIN HFA) 108 (90 Base) MCG/ACT inhaler SMARTSIG:2 Puff(s) By Mouth Every 4 Hours PRN   Cholecalciferol (VITAMIN D3) 2000 units TABS Take 2,000 Units by mouth daily.   Cyanocobalamin (VITAMIN B12) 1000 MCG TBCR 1 tablet   dexlansoprazole (DEXILANT) 60 MG capsule Take 60 mg by mouth daily.   guaiFENesin (MUCINEX) 600 MG 12 hr tablet 1 tablet as needed   Omega-3 Fatty Acids (FISH OIL) 1000 MG CAPS 1 capsule   pravastatin (PRAVACHOL) 20 MG tablet Take 1 tablet (20 mg total) by mouth every evening.   vitamin C (ASCORBIC ACID) 500 MG tablet Take 1,000 mg by mouth 2 (two) times daily.     Allergies:   Atorvastatin, Betadine [povidone iodine], and Sulfa antibiotics   Social History   Socioeconomic History   Marital status: Married    Spouse  name: Not on file   Number of children: Not on file   Years of education: Not on file   Highest education level: Not on file  Occupational History   Not on file  Tobacco Use   Smoking status: Never   Smokeless tobacco: Never  Substance and Sexual Activity   Alcohol use: No   Drug use: No   Sexual activity: Not on file  Other Topics Concern   Not on file  Social History Narrative   Not on file   Social Determinants of Health   Financial Resource Strain: Not on file  Food Insecurity: Not on file  Transportation Needs: Not on file  Physical Activity: Not on file  Stress: Not on file  Social Connections: Not on  file     Family History: The patient's family history is not on file.  ROS:   Please see the history of present illness.     All other systems reviewed and are negative.  EKGs/Labs/Other Studies Reviewed:    The following studies were reviewed today:  Heart monitor 12/13/20: Patch Wear Time:  13 days and 22 hours (2022-01-27T08:25:35-0500 to 2022-02-10T06:27:16-0500)   Patient had a min HR of 48 bpm, max HR of 182 bpm, and avg HR of 71 bpm. Predominant underlying rhythm was Sinus Rhythm. 105 Supraventricular Tachycardia runs occurred, the run with the fastest interval lasting 8 beats with a max rate of 207 bpm, the  longest lasting 16.4 secs with an avg rate of 108 bpm. Some episodes of Supraventricular Tachycardia may be possible Atrial Tachycardia with variable block. Isolated SVEs were rare (<1.0%), SVE Couplets were rare (<1.0%), and SVE Triplets were rare  (<1.0%). Isolated VEs were rare (<1.0%, 4954), VE Couplets were rare (<1.0%, 142), and VE Triplets were rare (<1.0%, 3). Ventricular Bigeminy and Trigeminy were present. 4 patient triggered events corresponding to sinus rhythm with PACs/PVCs  EKG:  EKG is  ordered today.  The ekg ordered today demonstrates sinus rhythm, HR 68 LAFB  Recent Labs: No results found for requested labs within last 8760 hours.  Recent Lipid Panel No results found for: CHOL, TRIG, HDL, CHOLHDL, VLDL, LDLCALC, LDLDIRECT  Physical Exam:    VS:  BP 120/71   Pulse 68   Ht 4\' 11"  (1.499 m)   Wt 125 lb 6.4 oz (56.9 kg)   SpO2 98%   BMI 25.33 kg/m     Wt Readings from Last 3 Encounters:  09/18/21 125 lb 6.4 oz (56.9 kg)  11/19/20 131 lb 12.8 oz (59.8 kg)  05/16/20 128 lb 6.4 oz (58.2 kg)     GEN:  Well nourished, well developed in no acute distress HEENT: Normal NECK: No JVD; No carotid bruits LYMPHATICS: No lymphadenopathy CARDIAC: RRR, no murmurs, rubs, gallops RESPIRATORY:  Clear to auscultation without rales, wheezing or rhonchi   ABDOMEN: Soft, non-tender, non-distended MUSCULOSKELETAL:  No edema; No deformity  SKIN: Warm and dry NEUROLOGIC:  Alert and oriented x 3 PSYCHIATRIC:  Normal affect   ASSESSMENT:    1. Paroxysmal SVT (supraventricular tachycardia) (HCC)   2. Palpitations   3. Hyperlipidemia, unspecified hyperlipidemia type   4. Aortic valve stenosis, etiology of cardiac valve disease unspecified   5. MVP (mitral valve prolapse)   6. Coronary artery disease involving native coronary artery of native heart without angina pectoris    PLAN:    In order of problems listed above:  Moderate MR/mild AS Repeat an echo 04/2022 No dyspnea, chest pain, or swelling.  Hyperlipidemia Continue statin. 10 mg Crestor was started for an LDL of 185.  She was advised to try a statin holiday for 5 days for knee pain.  Her knee pain improved and her statin was changed to pravastatin. She stopped pravastatin for paxlovid and her leg pain resolved. If pain persists, may take every other day.   Lipid panel 06/27/2021: Total cholesterol: 219 HDL: 49 LDL: 142 Triglycerides 153  A1c 6.2%   CAD CCTA for chest pain showed calcium score 0, nonobstructive CAD Focus on risk factor management Continue statin as tolerated.    Palpitations Paroxysmal SVT She was asymptomatic during SVT on heart monitor, but is now having palpitations at night. She is having them weekly and worse with caffeine. We discussed vagal maneuvers. I do not want to give a BB as her HR is in the 60s here today.  Continue to follow.  She is very active, may consider SVT ablation if these become more problematic.   Medication Adjustments/Labs and Tests Ordered: Current medicines are reviewed at length with the patient today.  Concerns regarding medicines are outlined above.  Orders Placed This Encounter  Procedures   EKG 12-Lead    No orders of the defined types were placed in this encounter.   Signed, Ledora Bottcher, [place]  09/18/2021  10:23 AM    [facility]

## 2021-09-18 ENCOUNTER — Ambulatory Visit: Payer: PPO | Admitting: Physician Assistant

## 2021-09-18 ENCOUNTER — Other Ambulatory Visit: Payer: Self-pay

## 2021-09-18 ENCOUNTER — Encounter: Payer: Self-pay | Admitting: Physician Assistant

## 2021-09-18 VITALS — BP 120/71 | HR 68 | Ht 59.0 in | Wt 125.4 lb

## 2021-09-18 DIAGNOSIS — I2510 Atherosclerotic heart disease of native coronary artery without angina pectoris: Secondary | ICD-10-CM | POA: Diagnosis not present

## 2021-09-18 DIAGNOSIS — I350 Nonrheumatic aortic (valve) stenosis: Secondary | ICD-10-CM | POA: Insufficient documentation

## 2021-09-18 DIAGNOSIS — E785 Hyperlipidemia, unspecified: Secondary | ICD-10-CM

## 2021-09-18 DIAGNOSIS — I471 Supraventricular tachycardia: Secondary | ICD-10-CM | POA: Diagnosis not present

## 2021-09-18 DIAGNOSIS — I341 Nonrheumatic mitral (valve) prolapse: Secondary | ICD-10-CM | POA: Insufficient documentation

## 2021-09-18 DIAGNOSIS — R002 Palpitations: Secondary | ICD-10-CM | POA: Diagnosis not present

## 2021-09-18 NOTE — Patient Instructions (Addendum)
Medication Instructions:  Your physician recommends that you continue on your current medications as directed. Please refer to the Current Medication list given to you today.  *If you need a refill on your cardiac medications before your next appointment, please call your pharmacy*  Lab Work: NONE ordered at this time of appointment   If you have labs (blood work) drawn today and your tests are completely normal, you will receive your results only by: MyChart Message (if you have MyChart) OR A paper copy in the mail If you have any lab test that is abnormal or we need to change your treatment, we will call you to review the results.  Testing/Procedures: NONE ordered at this time of appointment   Follow-Up: At [facility], you and your health needs are our priority.  As part of our continuing mission to provide you with exceptional heart care, we have created designated Provider Care Teams.  These Care Teams include your primary Cardiologist (physician) and Advanced Practice Providers (APPs -  Physician Assistants and Nurse Practitioners) who all work together to provide you with the care you need, when you need it.  We recommend signing up for the patient portal called "MyChart".  Sign up information is provided on this After Visit Summary.  MyChart is used to connect with patients for Virtual Visits (Telemedicine).  Patients are able to view lab/test results, encounter notes, upcoming appointments, etc.  Non-urgent messages can be sent to your provider as well.   To learn more about what you can do with MyChart, go to [email or web address].    Your next appointment:   6 month(s)  The format for your next appointment:   In Person  Provider:   Christopher L Schumann, MD    Other Instructions   

## 2021-09-24 DIAGNOSIS — Z1231 Encounter for screening mammogram for malignant neoplasm of breast: Secondary | ICD-10-CM | POA: Diagnosis not present

## 2021-12-18 DIAGNOSIS — M25562 Pain in left knee: Secondary | ICD-10-CM | POA: Diagnosis not present

## 2021-12-18 DIAGNOSIS — M25561 Pain in right knee: Secondary | ICD-10-CM | POA: Diagnosis not present

## 2021-12-24 DIAGNOSIS — M67432 Ganglion, left wrist: Secondary | ICD-10-CM | POA: Diagnosis not present

## 2021-12-24 DIAGNOSIS — M25531 Pain in right wrist: Secondary | ICD-10-CM | POA: Diagnosis not present

## 2021-12-24 DIAGNOSIS — M25562 Pain in left knee: Secondary | ICD-10-CM | POA: Diagnosis not present

## 2022-01-29 DIAGNOSIS — I340 Nonrheumatic mitral (valve) insufficiency: Secondary | ICD-10-CM | POA: Diagnosis not present

## 2022-01-29 DIAGNOSIS — Z0000 Encounter for general adult medical examination without abnormal findings: Secondary | ICD-10-CM | POA: Diagnosis not present

## 2022-01-29 DIAGNOSIS — J42 Unspecified chronic bronchitis: Secondary | ICD-10-CM | POA: Diagnosis not present

## 2022-01-29 DIAGNOSIS — N393 Stress incontinence (female) (male): Secondary | ICD-10-CM | POA: Diagnosis not present

## 2022-01-29 DIAGNOSIS — K219 Gastro-esophageal reflux disease without esophagitis: Secondary | ICD-10-CM | POA: Diagnosis not present

## 2022-01-29 DIAGNOSIS — N3281 Overactive bladder: Secondary | ICD-10-CM | POA: Diagnosis not present

## 2022-01-29 DIAGNOSIS — R7303 Prediabetes: Secondary | ICD-10-CM | POA: Diagnosis not present

## 2022-01-29 DIAGNOSIS — M150 Primary generalized (osteo)arthritis: Secondary | ICD-10-CM | POA: Diagnosis not present

## 2022-01-29 DIAGNOSIS — I2510 Atherosclerotic heart disease of native coronary artery without angina pectoris: Secondary | ICD-10-CM | POA: Diagnosis not present

## 2022-01-29 DIAGNOSIS — K449 Diaphragmatic hernia without obstruction or gangrene: Secondary | ICD-10-CM | POA: Diagnosis not present

## 2022-01-29 DIAGNOSIS — E782 Mixed hyperlipidemia: Secondary | ICD-10-CM | POA: Diagnosis not present

## 2022-01-29 DIAGNOSIS — I700 Atherosclerosis of aorta: Secondary | ICD-10-CM | POA: Diagnosis not present

## 2022-02-05 ENCOUNTER — Other Ambulatory Visit: Payer: Self-pay | Admitting: Sports Medicine

## 2022-02-05 ENCOUNTER — Ambulatory Visit
Admission: RE | Admit: 2022-02-05 | Discharge: 2022-02-05 | Disposition: A | Discharge status: Home / Self Care | Payer: PPO | Source: Ambulatory Visit | Attending: Sports Medicine | Admitting: Sports Medicine

## 2022-02-05 DIAGNOSIS — M25562 Pain in left knee: Secondary | ICD-10-CM

## 2022-02-05 DIAGNOSIS — M1712 Unilateral primary osteoarthritis, left knee: Secondary | ICD-10-CM | POA: Diagnosis not present

## 2022-02-17 NOTE — Therapy (Signed)
?OUTPATIENT PHYSICAL THERAPY LOWER EXTREMITY EVALUATION ? ? ?Patient Name: Taylor Jacobs ?MRN: [id number] ?DOB:[birth year], 87 y.o., female ?Today's Date: 02/20/2022 ? ? PT End of Session - 02/20/22 1101   ? ? Visit Number 1   ? Number of Visits 11   ? Date for PT Re-Evaluation 03/27/22   ? Authorization Type [employer]   ? Authorization Time Period no auth required   ? PT Start Time 1101   ? PT Stop Time 3762   ? PT Time Calculation (min) 51 min   ? Activity Tolerance Patient tolerated treatment well   ? Behavior During Therapy [facility] for tasks assessed/performed   ? ?  ?  ? ?  ? ? ?Past Medical History:  ?Diagnosis Date  ? GERD (gastroesophageal reflux disease)   ? Headache   ? Heart murmur   ? ?Past Surgical History:  ?Procedure Laterality Date  ? ABDOMINAL HYSTERECTOMY    ? partial  ? CESAREAN SECTION    ? x 3  ? COLONOSCOPY WITH PROPOFOL N/A 12/04/2015  ? Procedure: COLONOSCOPY WITH PROPOFOL;  Surgeon: Garlan Fair, MD;  Location: [facility];  Service: Endoscopy;  Laterality: N/A;  ? ESOPHAGOGASTRODUODENOSCOPY (EGD) WITH PROPOFOL N/A 12/04/2015  ? Procedure: ESOPHAGOGASTRODUODENOSCOPY (EGD) WITH PROPOFOL;  Surgeon: Garlan Fair, MD;  Location: [facility];  Service: Endoscopy;  Laterality: N/A;  ? EYE SURGERY    ? bilateral cataract   ? ?Patient Active Problem List  ? Diagnosis Date Noted  ? Paroxysmal SVT (supraventricular tachycardia) ([place]) 09/18/2021  ? Hyperlipidemia 09/18/2021  ? Coronary artery disease involving native coronary artery of native heart without angina pectoris 09/18/2021  ? MVP (mitral valve prolapse) 09/18/2021  ? Aortic valve stenosis 09/18/2021  ? ? ?PCP: Mayra Neer, MD ? ?REFERRING PROVIDER: Inez Catalina, MD ? ?REFERRING DIAG: M25.562 (ICD-10-CM) - Pain in left knee ? ?THERAPY DIAG:  ?Acute pain of left knee ? ?Muscle weakness (generalized) ? ?Other abnormalities of gait and mobility ? ?ONSET DATE: 1 month ago ? ?SUBJECTIVE:  ? ?SUBJECTIVE STATEMENT: ?"Patient  reports she has arthritis in left knee. In the morning it hurts in the morning and hurts the most at night. Patient reports she recently got approved for a foam shot in knee and is waiting on that. ? ?PERTINENT HISTORY: ?GERD, CAD, mitral valve prolapse, aortic valve stenosis ? ?PAIN:  ?Are you having pain? Yes: NPRS scale: 5/10 ?Pain location: lateral left knee pain ?Pain description: ache ?Aggravating factors: standing and walking ?Relieving factors: Voltaren cream ? ?PRECAUTIONS: None ? ?WEIGHT BEARING RESTRICTIONS No ? ?FALLS:  ?Has patient fallen in last 6 months? No ? ?LIVING ENVIRONMENT: ?Lives with: lives with their spouse, lives with their daughter, and granddaughter ?Lives in: House/apartment ?Stairs: Yes: Internal: 12 steps; on right going up ?Has following equipment at home: None, use a stick when walking in yard outside ? ?OCCUPATION: works events at [employer] ? ?PLOF: Independent ? ?PATIENT GOALS "To decrease knee pain." ? ? ?OBJECTIVE:  ? ?DIAGNOSTIC FINDINGS:  ?X-ray: Diffuse tricompartment degenerative change with chondrocalcinosis. ?Small loose body cannot be excluded. No acute abnormality ?identified. ? ?PATIENT SURVEYS:  ?FOTO 54, predicted 43 ? ?COGNITION: ? Overall cognitive status: Within functional limits for tasks assessed   ?  ?SENSATION: ?WFL ? ? ?POSTURE:  ?ER of left LE in standing and ambulating along with calcaneal valgus, bilateral mild genu valgum ? ?PALPATION: ?TTP left lateral superior gastroc, left knee joint lines ? ?LE ROM: ? ?Active ROM Right ?02/20/2022 Left ?02/20/2022  ?  Hip flexion    ?Hip extension    ?Hip abduction    ?Hip adduction    ?Hip internal rotation    ?Hip external rotation    ?Knee flexion 115 115*  ?Knee extension -5 -5  ?Ankle dorsiflexion    ?Ankle plantarflexion    ?Ankle inversion    ?Ankle eversion    ? (Blank rows = not tested) ? ?LE MMT: ? ?MMT Right ?02/20/2022 Left ?02/20/2022  ?Hip flexion 4 4  ?Hip extension 2 2  ?Hip abduction 3- 3-  ?Hip adduction     ?Hip internal rotation 4 2+  ?Hip external rotation 4 2+  ?Knee flexion 4 4-  ?Knee extension 5 3+  ?Ankle dorsiflexion 4 4  ?Ankle plantarflexion 4 4  ?Ankle inversion    ?Ankle eversion    ? (Blank rows = not tested) ? ?LOWER EXTREMITY SPECIAL TESTS:  ?Knee special tests: Anterior drawer test: negative and Posterior drawer test: negative ?Ely test: + bil ?90/90 hamstring left: lacks 10 degrees ?Thomas test: negative ? ?FUNCTIONAL TESTS:  ?5 times sit to stand: 18 seconds ?Timed up and go (TUG): 19 seconds  ? ?GAIT: ?Distance walked: lobby to PT gym ?Assistive device utilized: None ?Level of assistance: Complete Independence ?Comments: decreased toe off and ER of left LE and calcaneal valgus ? ? ? ?TODAY'S TREATMENT: ?Discussed findings in evaluation and HEP. ? ? ?PATIENT EDUCATION:  ?Education details: Discussed findings in evaluation and HEP. ?Person educated: Patient ?Education method: Explanation, Demonstration, Tactile cues, Verbal cues, and Handouts ?Education comprehension: verbalized understanding ? ? ?HOME EXERCISE PROGRAM: ?Access Code: [email or web address] ?URL: [email or web address] ?Date: 02/20/2022 ?Prepared by: Edythe Lynn ? ?Exercises ?- Seated Knee Flexion Extension AROM   - 1 x daily - 7 x weekly - 3 sets - 10 reps ?- Clamshell  - 1 x daily - 7 x weekly - 1 sets - 10 reps ?- Sidelying Hip Abduction  - 1 x daily - 7 x weekly - 1 sets - 10 reps ? ?ASSESSMENT: ? ?CLINICAL IMPRESSION: ?Patient is a 87 y.o. female who was seen today for physical therapy evaluation and treatment for left knee pain. She demonstrates weakness in lateral and posterior bilateral hip musculature along with left LE weakness affecting gait mechanics. She demonstrates gait abnormalities with left LE ER and no toe-off. LE weakness and limited flexibility can be causing abnormal forces on left knee joint. Patient will benefit from skilled PT services to address deficits to improve gait mechanics and reduce knee pain.   ? ? ?OBJECTIVE IMPAIRMENTS Abnormal gait, decreased strength, impaired flexibility, and pain.  ? ?ACTIVITY LIMITATIONS community activity, occupation, and yard work.  ? ?PERSONAL FACTORS Age and 1-2 comorbidities: CAD, paroxysmal SVT  are also affecting patient's functional outcome.  ? ? ?REHAB POTENTIAL: Good ? ?CLINICAL DECISION MAKING: Stable/uncomplicated ? ?EVALUATION COMPLEXITY: Low ? ? ?GOALS: ? ?SHORT TERM GOALS: Target date: 03/06/2022 ? ?Patient will be independent with HEP for PT progression. ?Baseline: initial HEP addressed ?Goal status: INITIAL ? ? ?LONG TERM GOALS: Target date: 03/27/2022 ? ?Patient will score 78 on FOTO. ?Baseline: 54 ?Goal status: INITIAL ? ?2.  Patient will demonstrate >/= 4/5 LE MMT to demonstrate improved LE strength. ?Baseline: 2 bil hip EXT, 3- bil hip EXT, 2+ left hip IR/ER, 3+ left knee EXT ?Goal status: INITIAL ? ?3.  Patient will demonstrate proper toe off for gait mechanics when ambulating 25 feet.  ?Baseline: left hip ER and no toe off demonstrated ?Goal status: INITIAL ? ?4.  Patient will perform 5X STS in </= 14.8 seconds to demonstrate improved LE strength. ?Baseline: 18 seconds ?Goal status: INITIAL ? ?5.  Patient will be able to perform TUG in </=13.5 seconds to demonstrate reduced risk of falls and improved LE strength. ?Baseline: 19 seconds ?Goal status: INITIAL ? ? ?PLAN: ?PT FREQUENCY: 2x/week ? ?PT DURATION: other: 5 weeks ? ?PLANNED INTERVENTIONS: Therapeutic exercises, Therapeutic activity, Neuromuscular re-education, Balance training, Gait training, Patient/Family education, Joint mobilization, Stair training, Dry Needling, Electrical stimulation, Cryotherapy, Moist heat, Taping, and Manual therapy ? ?PLAN FOR NEXT SESSION: LE strengthening and gait training as appropriate. Add quad stretching in next session.  ? ? ?Gillermina Phy, PT, DPT ?02/20/2022, 1:04 PM ? ?

## 2022-02-20 ENCOUNTER — Other Ambulatory Visit: Payer: Self-pay

## 2022-02-20 ENCOUNTER — Ambulatory Visit: Payer: PPO | Attending: Sports Medicine

## 2022-02-20 DIAGNOSIS — M25562 Pain in left knee: Secondary | ICD-10-CM | POA: Diagnosis not present

## 2022-02-20 DIAGNOSIS — R2689 Other abnormalities of gait and mobility: Secondary | ICD-10-CM | POA: Insufficient documentation

## 2022-02-20 DIAGNOSIS — M6281 Muscle weakness (generalized): Secondary | ICD-10-CM | POA: Diagnosis not present

## 2022-03-04 ENCOUNTER — Encounter: Payer: Self-pay | Admitting: Physical Therapy

## 2022-03-04 ENCOUNTER — Ambulatory Visit: Payer: PPO | Attending: Sports Medicine | Admitting: Physical Therapy

## 2022-03-04 DIAGNOSIS — M25562 Pain in left knee: Secondary | ICD-10-CM

## 2022-03-04 DIAGNOSIS — R2689 Other abnormalities of gait and mobility: Secondary | ICD-10-CM | POA: Diagnosis not present

## 2022-03-04 DIAGNOSIS — M6281 Muscle weakness (generalized): Secondary | ICD-10-CM

## 2022-03-04 NOTE — Therapy (Signed)
?OUTPATIENT PHYSICAL THERAPY TREATMENT NOTE ? ? ?Patient Name: Taylor Jacobs ?MRN: [id number] ?DOB:1934/11/07, 87 y.o., female ?Today's Date: 03/04/2022 ? ? ? ?END OF SESSION:  ? PT End of Session - 03/04/22 6269   ? ? Visit Number 2   ? Number of Visits 11   ? Date for PT Re-Evaluation 03/27/22   ? Authorization Type [employer]   ? PT Start Time 0845   ? PT Stop Time 0930   ? PT Time Calculation (min) 45 min   ? ?  ?  ? ?  ? ? ?Past Medical History:  ?Diagnosis Date  ? GERD (gastroesophageal reflux disease)   ? Headache   ? Heart murmur   ? ?Past Surgical History:  ?Procedure Laterality Date  ? ABDOMINAL HYSTERECTOMY    ? partial  ? CESAREAN SECTION    ? x 3  ? COLONOSCOPY WITH PROPOFOL N/A 12/04/2015  ? Procedure: COLONOSCOPY WITH PROPOFOL;  Surgeon: Garlan Fair, MD;  Location: [facility];  Service: Endoscopy;  Laterality: N/A;  ? ESOPHAGOGASTRODUODENOSCOPY (EGD) WITH PROPOFOL N/A 12/04/2015  ? Procedure: ESOPHAGOGASTRODUODENOSCOPY (EGD) WITH PROPOFOL;  Surgeon: Garlan Fair, MD;  Location: [facility];  Service: Endoscopy;  Laterality: N/A;  ? EYE SURGERY    ? bilateral cataract   ? ?Patient Active Problem List  ? Diagnosis Date Noted  ? Paroxysmal SVT (supraventricular tachycardia) ([place]) 09/18/2021  ? Hyperlipidemia 09/18/2021  ? Coronary artery disease involving native coronary artery of native heart without angina pectoris 09/18/2021  ? MVP (mitral valve prolapse) 09/18/2021  ? Aortic valve stenosis 09/18/2021  ?PCP: Mayra Neer, MD ?  ?REFERRING PROVIDER: Inez Catalina, MD ?  ?REFERRING DIAG: M25.562 (ICD-10-CM) - Pain in left knee ? ?THERAPY DIAG:  ?Acute pain of left knee ? ?Muscle weakness (generalized) ? ?Other abnormalities of gait and mobility ? ?PERTINENT HISTORY: GERD, CAD, mitral valve prolapse, aortic valve stenosis  ? ?PRECAUTIONS: None  ? ?SUBJECTIVE: No pain today. I have not done any of the exercises. They are too small to see. I have been doing the ones the doctor gave  me.  ? ?PAIN:  ?Are you having pain? Yes: NPRS scale: 0/10 ?Pain location: lateral left knee pain ?Pain description: ache ?Aggravating factors: standing and walking ?Relieving factors: Voltaren cream ? ? ?OBJECTIVE: (objective measures completed at initial evaluation unless otherwise dated) ? ?  ?DIAGNOSTIC FINDINGS:  ?X-ray: Diffuse tricompartment degenerative change with chondrocalcinosis. ?Small loose body cannot be excluded. No acute abnormality ?identified. ?  ?PATIENT SURVEYS:  ?FOTO 54, predicted 19 ?  ?COGNITION: ?          Overall cognitive status: Within functional limits for tasks assessed               ?           ?SENSATION: ?WFL ?  ?  ?POSTURE:  ?ER of left LE in standing and ambulating along with calcaneal valgus, bilateral mild genu valgum ?  ?PALPATION: ?TTP left lateral superior gastroc, left knee joint lines ?  ?LE ROM: ?  ?Active ROM Right ?02/20/2022 Left ?02/20/2022  ?Hip flexion      ?Hip extension      ?Hip abduction      ?Hip adduction      ?Hip internal rotation      ?Hip external rotation      ?Knee flexion 115 115*  ?Knee extension -5 -5  ?Ankle dorsiflexion      ?Ankle plantarflexion      ?  Ankle inversion      ?Ankle eversion      ? (Blank rows = not tested) ?  ?LE MMT: ?  ?MMT Right ?02/20/2022 Left ?02/20/2022  ?Hip flexion 4 4  ?Hip extension 2 2  ?Hip abduction 3- 3-  ?Hip adduction      ?Hip internal rotation 4 2+  ?Hip external rotation 4 2+  ?Knee flexion 4 4-  ?Knee extension 5 3+  ?Ankle dorsiflexion 4 4  ?Ankle plantarflexion 4 4  ?Ankle inversion      ?Ankle eversion      ? (Blank rows = not tested) ?  ?LOWER EXTREMITY SPECIAL TESTS:  ?Knee special tests: Anterior drawer test: negative and Posterior drawer test: negative ?Ely test: + bil ?90/90 hamstring left: lacks 10 degrees ?Thomas test: negative ?  ?FUNCTIONAL TESTS:  ?5 times sit to stand: 18 seconds ?Timed up and go (TUG): 19 seconds  ?  ?GAIT: ?Distance walked: lobby to PT gym ?Assistive device utilized: None ?Level of  assistance: Complete Independence ?Comments: decreased toe off and ER of left LE and calcaneal valgus ?  ?  ?  ?TODAY'S TREATMENT: ?[facility] Adult PT Treatment:                                                DATE: 03/04/22 ?Therapeutic Exercise: ?Sidelying clam 10 x 3 ?Sidelying hip abduction 10 x 3 ?Ball squeeze with LAQ 10 x 2  ?Seated clam green 10 x 3  ?Seated march with green band 10 x 3 each  ?Seated hamstring curls with green band 10 x 3 each  ?Seated LAQ with green band 10 x 2 each  ?Sit-stand x 10 from elevated mat ?Standing heel raise x 10, toe raise x 10 ?Seated gastroc stretch with towel 30 sec x 2 each  ? ? ?INITIAL TREATMENT: ?Discussed findings in evaluation and HEP. ?  ?  ?PATIENT EDUCATION:  ?Education details: Discussed findings in evaluation and HEP. ?Person educated: Patient ?Education method: Explanation, Demonstration, Tactile cues, Verbal cues, and Handouts ?Education comprehension: verbalized understanding ?  ?  ?HOME EXERCISE PROGRAM: ?Access Code: [email or web address] ?Access Code: [email or web address] ?URL: [email or web address] ?Date: 03/04/2022 ?Prepared by: Hessie Diener ? ?Exercises ?- Seated Knee Flexion Extension AROM   - 1 x daily - 7 x weekly - 3 sets - 10 reps ?- Clamshell  - 1 x daily - 7 x weekly - 1 sets - 10 reps ?- Sidelying Hip Abduction  - 1 x daily - 7 x weekly - 1 sets - 10 reps ?- seated clam with resistance band  - 1 x daily - 7 x weekly - 1 sets - 10 reps ?- Seated March with Resistance  - 1 x daily - 7 x weekly - 2 sets - 10 reps ?- Seated Long Arc Quad with Hip Adduction  - 1 x daily - 7 x weekly - 2 sets - 10 reps ?- Seated Hamstring Curls with Resistance  - 1 x daily - 7 x weekly - 2 sets - 10 reps ?- Seated Calf Stretch with Strap  - 1 x daily - 7 x weekly - 1 sets - 3 reps - 30 hold ?  ?ASSESSMENT: ?  ?CLINICAL IMPRESSION: ?Patient is a 87 y.o. female who was seen today for physical therapy  treatment for left knee pain.  She has not performed HEP due  to not being able to see  the print out and requests larger pictures. Time spent reviewing HEP and progressing HEP with seated therex per pt request. She declines to lie flat due to discomfort. Patient will benefit from skilled PT services to address deficits to improve gait mechanics and reduce knee pain.  ?  ?  ?OBJECTIVE IMPAIRMENTS Abnormal gait, decreased strength, impaired flexibility, and pain.  ?  ?ACTIVITY LIMITATIONS community activity, occupation, and yard work.  ?  ?PERSONAL FACTORS Age and 1-2 comorbidities: CAD, paroxysmal SVT  are also affecting patient's functional outcome.  ?  ?  ?REHAB POTENTIAL: Good ?  ?CLINICAL DECISION MAKING: Stable/uncomplicated ?  ?EVALUATION COMPLEXITY: Low ?  ?  ?GOALS: ?  ?SHORT TERM GOALS: Target date: 03/06/2022 ?  ?Patient will be independent with HEP for PT progression. ?Baseline: initial HEP addressed ?Goal status: INITIAL ?  ?  ?LONG TERM GOALS: Target date: 03/27/2022 ?  ?Patient will score 78 on FOTO. ?Baseline: 54 ?Goal status: INITIAL ?  ?2.  Patient will demonstrate >/= 4/5 LE MMT to demonstrate improved LE strength. ?Baseline: 2 bil hip EXT, 3- bil hip EXT, 2+ left hip IR/ER, 3+ left knee EXT ?Goal status: INITIAL ?  ?3.  Patient will demonstrate proper toe off for gait mechanics when ambulating 25 feet.  ?Baseline: left hip ER and no toe off demonstrated ?Goal status: INITIAL ?  ?4.  Patient will perform 5X STS in </= 14.8 seconds to demonstrate improved LE strength. ?Baseline: 18 seconds ?Goal status: INITIAL ?  ?5.  Patient will be able to perform TUG in </=13.5 seconds to demonstrate reduced risk of falls and improved LE strength. ?Baseline: 19 seconds ?Goal status: INITIAL ?  ?  ?PLAN: ?PT FREQUENCY: 2x/week ?  ?PT DURATION: other: 5 weeks ?  ?PLANNED INTERVENTIONS: Therapeutic exercises, Therapeutic activity, Neuromuscular re-education, Balance training, Gait training, Patient/Family education, Joint mobilization, Stair training, Dry Needling, Electrical stimulation, Cryotherapy,  Moist heat, Taping, and Manual therapy ?  ?PLAN FOR NEXT SESSION: assess response to newest HEP. LE strengthening and gait training as appropriate. Add quad stretching in next session.  ?  ?  ? ? ? ?Marshell Garfinkel

## 2022-03-07 ENCOUNTER — Encounter: Payer: PPO | Admitting: Physical Therapy

## 2022-03-12 DIAGNOSIS — M25561 Pain in right knee: Secondary | ICD-10-CM | POA: Diagnosis not present

## 2022-03-12 DIAGNOSIS — M1712 Unilateral primary osteoarthritis, left knee: Secondary | ICD-10-CM | POA: Diagnosis not present

## 2022-03-13 ENCOUNTER — Encounter: Payer: Self-pay | Admitting: Physical Therapy

## 2022-03-13 ENCOUNTER — Ambulatory Visit: Payer: PPO | Admitting: Physical Therapy

## 2022-03-13 DIAGNOSIS — M25562 Pain in left knee: Secondary | ICD-10-CM

## 2022-03-13 DIAGNOSIS — M6281 Muscle weakness (generalized): Secondary | ICD-10-CM

## 2022-03-13 DIAGNOSIS — R2689 Other abnormalities of gait and mobility: Secondary | ICD-10-CM

## 2022-03-13 NOTE — Therapy (Addendum)
OUTPATIENT PHYSICAL THERAPY TREATMENT NOTE / DISCHARGE   Patient Name: Taylor Jacobs MRN: [id number] DOB:Nov 19, 1934, 87 y.o., female Today's Date: 11/11/2022    END OF SESSION:     Past Medical History:  Diagnosis Date   GERD (gastroesophageal reflux disease)    Headache    Heart murmur    Past Surgical History:  Procedure Laterality Date   ABDOMINAL HYSTERECTOMY     partial   CESAREAN SECTION     x 3   COLONOSCOPY WITH PROPOFOL N/A 12/04/2015   Procedure: COLONOSCOPY WITH PROPOFOL;  Surgeon: Garlan Fair, MD;  Location: [facility];  Service: Endoscopy;  Laterality: N/A;   ESOPHAGOGASTRODUODENOSCOPY (EGD) WITH PROPOFOL N/A 12/04/2015   Procedure: ESOPHAGOGASTRODUODENOSCOPY (EGD) WITH PROPOFOL;  Surgeon: Garlan Fair, MD;  Location: [facility];  Service: Endoscopy;  Laterality: N/A;   EYE SURGERY     bilateral cataract    Patient Active Problem List   Diagnosis Date Noted   Paroxysmal SVT (supraventricular tachycardia) 09/18/2021   Hyperlipidemia 09/18/2021   Coronary artery disease involving native coronary artery of native heart without angina pectoris 09/18/2021   MVP (mitral valve prolapse) 09/18/2021   Aortic valve stenosis 09/18/2021  PCP: Mayra Neer, MD   REFERRING PROVIDER: Inez Catalina, MD   REFERRING DIAG: [phone] (ICD-10-CM) - Pain in left knee  THERAPY DIAG:  Acute pain of left knee  Muscle weakness (generalized)  Other abnormalities of gait and mobility  PERTINENT HISTORY: GERD, CAD, mitral valve prolapse, aortic valve stenosis   PRECAUTIONS: None   SUBJECTIVE: A little pain today. I have tried the exercises I haven't been the best. I want to go over the exercises.  I got an injection in the right knee.   PAIN:  Are you having pain? Yes: NPRS scale: 5/10, worse in the evenings Pain location: lateral left knee pain Pain description: ache Aggravating factors: standing and walking Relieving factors: Voltaren  cream   OBJECTIVE: (objective measures completed at initial evaluation unless otherwise dated)    DIAGNOSTIC FINDINGS:  X-ray: Diffuse tricompartment degenerative change with chondrocalcinosis. Small loose body cannot be excluded. No acute abnormality identified.   PATIENT SURVEYS:  FOTO 54, predicted 16   COGNITION:           Overall cognitive status: Within functional limits for tasks assessed                          SENSATION: WFL     POSTURE:  ER of left LE in standing and ambulating along with calcaneal valgus, bilateral mild genu valgum   PALPATION: TTP left lateral superior gastroc, left knee joint lines   LE ROM:   Active ROM Right 02/20/2022 Left 02/20/2022  Hip flexion      Hip extension      Hip abduction      Hip adduction      Hip internal rotation      Hip external rotation      Knee flexion 115 115*  Knee extension -5 -5  Ankle dorsiflexion      Ankle plantarflexion      Ankle inversion      Ankle eversion       (Blank rows = not tested)   LE MMT:   MMT Right 02/20/2022 Left 02/20/2022 Left  03/13/22  Hip flexion 4 4   Hip extension 2 2   Hip abduction 3- 3-   Hip adduction  Hip internal rotation 4 2+ 4-/5  Hip external rotation 4 2+ 4-/5  Knee flexion 4 4- 4/5  Knee extension 5 3+ 4+/5 twinge of pain  Ankle dorsiflexion 4 4   Ankle plantarflexion 4 4   Ankle inversion       Ankle eversion        (Blank rows = not tested)   LOWER EXTREMITY SPECIAL TESTS:  Knee special tests: Anterior drawer test: negative and Posterior drawer test: negative Ely test: + bil 90/90 hamstring left: lacks 10 degrees Thomas test: negative   FUNCTIONAL TESTS:  5 times sit to stand: 18 seconds Timed up and go (TUG): 19 seconds    GAIT: Distance walked: lobby to [employer] device utilized: None Level of assistance: Complete Independence Comments: decreased toe off and ER of left LE and calcaneal valgus       TODAY'S TREATMENT: [facility]  Adult PT Treatment:                                                DATE: 03/13/22 Therapeutic Exercise: Nustep L3 x 5 min  Sit-stand x 10 from standard mat 13.2 sec Seated knee flexion and extension towel slide 10 x 3 L Sidelying clam 10 x 3 L Sidelying hip abduction 10 x 3 Ball squeeze with LAQ 10 x 3  Seated clam green 10 x 3  Seated march with green band 10 x 3 each  Seated hamstring curls with green band 10 x 3 each  Seated LAQ with green band 10 x 2 each  Seated gastroc stretch with towel 30 sec x 2 each    [facility] Adult PT Treatment:                                                DATE: 03/04/22 Therapeutic Exercise: Sidelying clam 10 x 3 Sidelying hip abduction 10 x 3 Ball squeeze with LAQ 10 x 2  Seated clam green 10 x 3  Seated march with green band 10 x 3 each  Seated hamstring curls with green band 10 x 3 each  Seated LAQ with green band 10 x 2 each  Sit-stand x 10 from elevated mat Standing heel raise x 10, toe raise x 10 Seated gastroc stretch with towel 30 sec x 2 each    INITIAL TREATMENT: Discussed findings in evaluation and HEP.     PATIENT EDUCATION:  Education details: Discussed findings in evaluation and HEP. Person educated: Patient Education method: Explanation, Demonstration, Tactile cues, Verbal cues, and Handouts Education comprehension: verbalized understanding     HOME EXERCISE PROGRAM: Access Code: [email or web address] Access Code: [email or web address] URL: [email or web address] Date: 03/04/2022 Prepared by: Hessie Diener  Exercises - Seated Knee Flexion Extension AROM   - 1 x daily - 7 x weekly - 3 sets - 10 reps - Clamshell  - 1 x daily - 7 x weekly - 1 sets - 10 reps - Sidelying Hip Abduction  - 1 x daily - 7 x weekly - 1 sets - 10 reps - seated clam with resistance band  - 1 x daily - 7 x weekly - 1 sets - 10 reps - Seated March with Resistance  - 1 x daily - 7 x  weekly - 2 sets - 10 reps - Seated Long Arc Quad with Hip Adduction  - 1 x daily - 7 x  weekly - 2 sets - 10 reps - Seated Hamstring Curls with Resistance  - 1 x daily - 7 x weekly - 2 sets - 10 reps - Seated Calf Stretch with Strap  - 1 x daily - 7 x weekly - 1 sets - 3 reps - 30 hold   ASSESSMENT:   CLINICAL IMPRESSION: Patient is a 87 y.o. female who was seen today for physical therapy  treatment for left knee pain.  She has tried her HEP some but requests to review them again. She received an injection in her right knee since she was here last and reports improvement in the right knee. Time spent reviewing HEP again. She declines to lie flat due to discomfort.Her Hip and knee MMT have improved on the left.  Patient will benefit from skilled PT services to address deficits to improve gait mechanics and reduce knee pain.      OBJECTIVE IMPAIRMENTS Abnormal gait, decreased strength, impaired flexibility, and pain.    ACTIVITY LIMITATIONS community activity, occupation, and yard work.    PERSONAL FACTORS Age and 1-2 comorbidities: CAD, paroxysmal SVT  are also affecting patient's functional outcome.      REHAB POTENTIAL: Good   CLINICAL DECISION MAKING: Stable/uncomplicated   EVALUATION COMPLEXITY: Low     GOALS:   SHORT TERM GOALS: Target date: 03/06/2022   Patient will be independent with HEP for PT progression. Baseline: initial HEP addressed Goal status 03/13/22: trying to perform, wants more clarification Goal status: ONGOING     LONG TERM GOALS: Target date: 03/27/2022   Patient will score 78 on FOTO. Baseline: 54 Goal status: INITIAL   2.  Patient will demonstrate >/= 4/5 LE MMT to demonstrate improved LE strength. Baseline: 2 bil hip EXT, 3- bil hip EXT, 2+ left hip IR/ER, 3+ left knee EXT Goal status: ONGOING- see objective chart   3.  Patient will demonstrate proper toe off for gait mechanics when ambulating 25 feet.  Baseline: left hip ER and no toe off demonstrated Goal status: INITIAL   4.  Patient will perform 5X STS in </= 14.8 seconds to  demonstrate improved LE strength. Baseline: 18 seconds Goal status: INITIAL   5.  Patient will be able to perform TUG in </=13.5 seconds to demonstrate reduced risk of falls and improved LE strength. Baseline: 19 seconds Goal status: INITIAL     PLAN: PT FREQUENCY: 2x/week   PT DURATION: other: 5 weeks   PLANNED INTERVENTIONS: Therapeutic exercises, Therapeutic activity, Neuromuscular re-education, Balance training, Gait training, Patient/Family education, Joint mobilization, Stair training, Dry Needling, Electrical stimulation, Cryotherapy, Moist heat, Taping, and Manual therapy   PLAN FOR NEXT SESSION: assess response to newest HEP. LE strengthening and gait training as appropriate. Add quad stretching in next session.         Hessie Diener, PTA 11/11/22 3:49 PM Phone: [phone] Fax: [phone]      PHYSICAL THERAPY DISCHARGE SUMMARY  Visits from Start of Care: 3  Current functional level related to goals / functional outcomes: Unknown due to patient not returning since last visit.   Remaining deficits: Unknown due to patient not returning since last visit.   Education / Equipment: HEP   Patient agrees to discharge. Patient goals were not met. Patient is being discharged due to not returning since the last visit.  Edythe Lynn, PT, DPT 11/11/22 3:50 PM

## 2022-03-18 ENCOUNTER — Ambulatory Visit: Payer: PPO | Admitting: Physical Therapy

## 2022-03-18 DIAGNOSIS — R54 Age-related physical debility: Secondary | ICD-10-CM | POA: Diagnosis not present

## 2022-03-18 DIAGNOSIS — R232 Flushing: Secondary | ICD-10-CM | POA: Diagnosis not present

## 2022-03-18 DIAGNOSIS — R5383 Other fatigue: Secondary | ICD-10-CM | POA: Diagnosis not present

## 2022-03-20 ENCOUNTER — Encounter: Payer: Self-pay | Admitting: Physical Therapy

## 2022-04-01 ENCOUNTER — Other Ambulatory Visit: Payer: Self-pay | Admitting: Sports Medicine

## 2022-04-01 ENCOUNTER — Ambulatory Visit
Admission: RE | Admit: 2022-04-01 | Discharge: 2022-04-01 | Disposition: A | Discharge status: Home / Self Care | Payer: PPO | Source: Ambulatory Visit | Attending: Sports Medicine | Admitting: Sports Medicine

## 2022-04-01 DIAGNOSIS — M25561 Pain in right knee: Secondary | ICD-10-CM

## 2022-04-01 DIAGNOSIS — R300 Dysuria: Secondary | ICD-10-CM | POA: Diagnosis not present

## 2022-06-04 DIAGNOSIS — Z961 Presence of intraocular lens: Secondary | ICD-10-CM | POA: Diagnosis not present

## 2022-06-04 DIAGNOSIS — H40013 Open angle with borderline findings, low risk, bilateral: Secondary | ICD-10-CM | POA: Diagnosis not present

## 2022-06-04 DIAGNOSIS — H353131 Nonexudative age-related macular degeneration, bilateral, early dry stage: Secondary | ICD-10-CM | POA: Diagnosis not present

## 2022-06-09 DIAGNOSIS — L309 Dermatitis, unspecified: Secondary | ICD-10-CM | POA: Diagnosis not present

## 2022-07-02 ENCOUNTER — Ambulatory Visit: Payer: PPO | Attending: Cardiology | Admitting: Cardiology

## 2022-07-02 ENCOUNTER — Encounter: Payer: Self-pay | Admitting: Cardiology

## 2022-07-02 VITALS — BP 112/70 | HR 72 | Ht 59.0 in | Wt 120.6 lb

## 2022-07-02 DIAGNOSIS — E785 Hyperlipidemia, unspecified: Secondary | ICD-10-CM | POA: Diagnosis not present

## 2022-07-02 DIAGNOSIS — R002 Palpitations: Secondary | ICD-10-CM | POA: Diagnosis not present

## 2022-07-02 DIAGNOSIS — I2510 Atherosclerotic heart disease of native coronary artery without angina pectoris: Secondary | ICD-10-CM | POA: Diagnosis not present

## 2022-07-02 DIAGNOSIS — R079 Chest pain, unspecified: Secondary | ICD-10-CM

## 2022-07-02 DIAGNOSIS — I350 Nonrheumatic aortic (valve) stenosis: Secondary | ICD-10-CM | POA: Diagnosis not present

## 2022-07-02 NOTE — Progress Notes (Signed)
Cardiology Office Note:    Date:  07/02/2022   ID:  Taylor Jacobs, DOB [birth year], MRN [id number]  PCP:  Mayra Neer, MD  Cardiologist:  Donato Heinz, MD  Electrophysiologist:  None   Referring MD: Mayra Neer, MD   Chief Complaint  Patient presents with   Chest Pain    History of Present Illness:    Taylor Jacobs is a 87 y.o. female with a hx of GERD, reported MVP, hiatal hernia, hyperlipidemia, prediabetes who presents for follow-up.  She was referred by Taylor Odor, PA for an evaluation of chest pain, initially seen on 03/14/2020.  She had ED visit with chest pain on 11/08/2019.  She reported that she had been having constant burning pain which she described as being similar to what she gets with GERD.  No relationship with exertion.  Work-up in the ED was negative, including EKG without ischemic changes, unremarkable chest x-ray, and troponins negative x2.  Patient was recommended to follow-up with GI specialist.  She reports that she has been having chest pain for over 6 months prior.  Described as pressure in the center of her chest that occurs daily.  Usually occurs at night.  States that typically occurs while she is resting, can last for hours.  Prior to the pandemic she was walking 30 minutes/day, but has not been exercising regularly anymore.  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  She reports dyspnea with walking up stairs but denies any chest pain.  She is also having episodes of palpitations that occur every night.  States that feels like heart is racing during episodes, can last for hours.  Mother had aortic aneurysm, had surgery at age 1.  Son has pacemaker, she is unsure of reason.  Never smoked.  Coronary CTA was done 04/06/2020, which showed calcium score 0, nonobstructive CAD with noncalcified plaque in proximal RCA causing 50 to 69% stenosis (CT FFR 0.92).  Echocardiogram on 04/05/2020 shows normal biventricular function,  mild to moderate MR and mild AS. Zio patch x3 days on 04/12/2020 showed episodes of SVT, longest lasting 19 beats.  Zio patch x14 days on 12/13/2020 showed 105 episodes of SVT, longest lasting 16 seconds.  Echocardiogram 05/16/2021 showed normal biventricular function, mild aortic stenosis, mild mitral regurgitation.  Since last clinic visit, she reports that she is having chest pain.  Has been occurring about once per week, describes as burning.  No clear relationship with exertion, can happen anytime.  Can last for hours.  She denies any dyspnea, lightheadedness, syncope, lower extremity edema, or palpitations.  Past Medical History:  Diagnosis Date   GERD (gastroesophageal reflux disease)    Headache    Heart murmur     Past Surgical History:  Procedure Laterality Date   ABDOMINAL HYSTERECTOMY     partial   CESAREAN SECTION     x 3   COLONOSCOPY WITH PROPOFOL N/A 12/04/2015   Procedure: COLONOSCOPY WITH PROPOFOL;  Surgeon: Garlan Fair, MD;  Location: [facility];  Service: Endoscopy;  Laterality: N/A;   ESOPHAGOGASTRODUODENOSCOPY (EGD) WITH PROPOFOL N/A 12/04/2015   Procedure: ESOPHAGOGASTRODUODENOSCOPY (EGD) WITH PROPOFOL;  Surgeon: Garlan Fair, MD;  Location: [facility];  Service: Endoscopy;  Laterality: N/A;   EYE SURGERY     bilateral cataract     Current Medications: Current Meds  Medication Sig   acetaminophen (TYLENOL) 500 MG tablet Take 1,000 mg by mouth every 6 (six) hours as needed (  For headache, pain or fever.).   albuterol (VENTOLIN HFA) 108 (90 Base) MCG/ACT inhaler SMARTSIG:2 Puff(s) By Mouth Every 4 Hours PRN   Cholecalciferol (VITAMIN D3) 2000 units TABS Take 2,000 Units by mouth daily.   Cyanocobalamin (VITAMIN B12) 1000 MCG TBCR 1 tablet   dexlansoprazole (DEXILANT) 60 MG capsule Take 60 mg by mouth daily.   guaiFENesin (MUCINEX) 600 MG 12 hr tablet 1 tablet as needed   pravastatin (PRAVACHOL) 20 MG tablet Take 1 tablet (20 mg total) by mouth every  evening.   vitamin C (ASCORBIC ACID) 500 MG tablet Take 1,000 mg by mouth 2 (two) times daily.     Allergies:   Atorvastatin, Betadine [povidone iodine], and Sulfa antibiotics   Social History   Socioeconomic History   Marital status: Married    Spouse name: Not on file   Number of children: Not on file   Years of education: Not on file   Highest education level: Not on file  Occupational History   Not on file  Tobacco Use   Smoking status: Never   Smokeless tobacco: Never  Substance and Sexual Activity   Alcohol use: No   Drug use: No   Sexual activity: Not on file  Other Topics Concern   Not on file  Social History Narrative   Not on file   Social Determinants of Health   Financial Resource Strain: Not on file  Food Insecurity: Not on file  Transportation Needs: Not on file  Physical Activity: Not on file  Stress: Not on file  Social Connections: Not on file     Family History: Mother had aortic aneurysm, had surgery at age 52.  Son has pacemaker, she is unsure of reason.  ROS:   Please see the history of present illness.    All other systems reviewed and are negative.  EKGs/Labs/Other Studies Reviewed:    The following studies were reviewed today:   EKG:   07/02/2022: Normal sinus rhythm, left axis deviation, rate 72, poor R wave progression 02/27/20: normal sinus rhythm, rate 69, no ST/T abnormalities, left axis deviation, poor R wave progression  Recent Labs: No results found for requested labs within last 365 days.  Recent Lipid Panel No results found for: "CHOL", "TRIG", "HDL", "CHOLHDL", "VLDL", "LDLCALC", "LDLDIRECT"  Physical Exam:    VS:  BP 112/70   Pulse 72   Ht '4\' 11"'$  (1.499 m)   Wt 120 lb 9.6 oz (54.7 kg)   SpO2 95%   BMI 24.36 kg/m     Wt Readings from Last 3 Encounters:  07/02/22 120 lb 9.6 oz (54.7 kg)  09/18/21 125 lb 6.4 oz (56.9 kg)  11/19/20 131 lb 12.8 oz (59.8 kg)     GEN: in no acute distress HEENT: Normal NECK: No JVD;  No carotid bruits LYMPHATICS: No lymphadenopathy CARDIAC: RRR, 2 out of 6 systolic murmur RESPIRATORY:  Clear to auscultation without rales, wheezing or rhonchi  ABDOMEN: Soft, non-tender, non-distended MUSCULOSKELETAL:  No edema; SKIN: Warm and dry NEUROLOGIC:  Alert and oriented x 3 PSYCHIATRIC:  Normal affect   ASSESSMENT:    1. CAD in native artery   2. Chest pain of uncertain etiology   3. Hyperlipidemia, unspecified hyperlipidemia type   4. Aortic valve stenosis, etiology of cardiac valve disease unspecified   5. Palpitations     PLAN:    CAD: Reported atypical chest pain.  Coronary CTA was done 04/06/2020, which showed calcium score 0, nonobstructive CAD with noncalcified plaque in  proximal RCA causing 50 to 69% stenosis (CT FFR 0.92). -Unable to tolerate atorvastatin or rosuvastatin, started pravastatin 20 mg daily but continues to have myalgias.  LDL 142 on 07/17/2021.  Check lipid panel and refer to pharmacy lipid clinic -Reporting atypical chest pain, will check Lexiscan Myoview to evaluate for ischemia  Mitral regurgitation: Mild to moderate MR on echo on 04/05/2020.  Mild on echocardiogram 04/2021  Aortic stenosis: Echocardiogram 05/16/2021 showed normal biventricular function, mild aortic stenosis, mild mitral regurgitation.  Palpitations: Zio patch x3 days showed short episodes of SVT, longest lasting 19 beats.  She appeared asymptomatic during these episodes.  Zio patch x14 days on 12/13/2020 showed 105 episodes of SVT, longest lasting 16 seconds.    Hyperlipidemia: Lipid panel showed LDL 185 on 11/20/2019.  Started rosuvastatin 10 mg daily on 04/06/2020.  She states that she only took for about a week, was unable to tolerate.  Subsequently tried atorvastatin 10 mg daily but again had leg pain so discontinued.  LDL 112 on 05/15/2020.  Started pravastatin 20 mg daily.  LDL 142 on 07/17/2021.  She continues to have myalgias with pravastatin but has been taking.  Check lipid panel  referred to pharmacy lipid clinic  RTC in 6 months  Shared Decision Making/Informed Consent The risks [chest pain, shortness of breath, cardiac arrhythmias, dizziness, blood pressure fluctuations, myocardial infarction, stroke/transient ischemic attack, nausea, vomiting, allergic reaction, radiation exposure, metallic taste sensation and life-threatening complications (estimated to be 1 in 10,000)], benefits (risk stratification, diagnosing coronary artery disease, treatment guidance) and alternatives of a nuclear stress test were discussed in detail with Ms. Barnier and she agrees to proceed.   Medication Adjustments/Labs and Tests Ordered: Current medicines are reviewed at length with the patient today.  Concerns regarding medicines are outlined above.  Orders Placed This Encounter  Procedures   Lipid panel   AMB Referral to Rafael Gonzalez   EKG 12-Lead   No orders of the defined types were placed in this encounter.   Patient Instructions  Medication Instructions:  Your physician recommends that you continue on your current medications as directed. Please refer to the Current Medication list given to you today.  *If you need a refill on your cardiac medications before your next appointment, please call your pharmacy*   Lab Work: Lipid panel today  If you have labs (blood work) drawn today and your tests are completely normal, you will receive your results only by: [place] (if you have MyChart) OR A paper copy in the mail If you have any lab test that is abnormal or we need to change your treatment, we will call you to review the results.   Testing/Procedures: Your physician has requested that you have a lexiscan myoview. For further information please visit [email or web address]. Please follow instruction sheet, as given. This will take place at [address]  How to prepare for your Myocardial Perfusion  Test: Do not eat or drink 3 hours prior to your test, except you may have water. Do not consume products containing caffeine (regular or decaffeinated) 12 hours prior to your test. (ex: coffee, chocolate, sodas, tea). Do bring a list of your current medications with you.  If not listed below, you may take your medications as normal. Do wear comfortable clothes (no dresses or overalls) and walking shoes, tennis shoes preferred (No heels or open toe shoes are allowed). Do NOT wear cologne, perfume, aftershave, or lotions (deodorant is allowed).  The test will take approximately 3 to 4 hours to complete If these instructions are not followed, your test will have to be rescheduled.   Follow-Up: At [facility], you and your health needs are our priority.  As part of our continuing mission to provide you with exceptional heart care, we have created designated Provider Care Teams.  These Care Teams include your primary Cardiologist (physician) and Advanced Practice Providers (APPs -  Physician Assistants and Nurse Practitioners) who all work together to provide you with the care you need, when you need it.  We recommend signing up for the patient portal called "MyChart".  Sign up information is provided on this After Visit Summary.  MyChart is used to connect with patients for Virtual Visits (Telemedicine).  Patients are able to view lab/test results, encounter notes, upcoming appointments, etc.  Non-urgent messages can be sent to your provider as well.   To learn more about what you can do with MyChart, go to [email or web address].    Your next appointment:   6 month(s)  The format for your next appointment:   In Person  Provider:   Donato Heinz, MD     Other Instructions You have been referred to: Lipid clinic with pharmacist    Important Information About Sugar         Signed, Donato Heinz, MD  07/02/2022 6:15 PM    [facility]

## 2022-07-02 NOTE — Patient Instructions (Signed)
Medication Instructions:  Your physician recommends that you continue on your current medications as directed. Please refer to the Current Medication list given to you today.  *If you need a refill on your cardiac medications before your next appointment, please call your pharmacy*   Lab Work: Lipid panel today  If you have labs (blood work) drawn today and your tests are completely normal, you will receive your results only by: [place] (if you have MyChart) OR A paper copy in the mail If you have any lab test that is abnormal or we need to change your treatment, we will call you to review the results.   Testing/Procedures: Your physician has requested that you have a lexiscan myoview. For further information please visit [email or web address]. Please follow instruction sheet, as given. This will take place at [address]  How to prepare for your Myocardial Perfusion Test: Do not eat or drink 3 hours prior to your test, except you may have water. Do not consume products containing caffeine (regular or decaffeinated) 12 hours prior to your test. (ex: coffee, chocolate, sodas, tea). Do bring a list of your current medications with you.  If not listed below, you may take your medications as normal. Do wear comfortable clothes (no dresses or overalls) and walking shoes, tennis shoes preferred (No heels or open toe shoes are allowed). Do NOT wear cologne, perfume, aftershave, or lotions (deodorant is allowed). The test will take approximately 3 to 4 hours to complete If these instructions are not followed, your test will have to be rescheduled.   Follow-Up: At [facility], you and your health needs are our priority.  As part of our continuing mission to provide you with exceptional heart care, we have created designated Provider Care Teams.  These Care Teams include your primary Cardiologist (physician) and Advanced Practice Providers (APPs -  Physician  Assistants and Nurse Practitioners) who all work together to provide you with the care you need, when you need it.  We recommend signing up for the patient portal called "MyChart".  Sign up information is provided on this After Visit Summary.  MyChart is used to connect with patients for Virtual Visits (Telemedicine).  Patients are able to view lab/test results, encounter notes, upcoming appointments, etc.  Non-urgent messages can be sent to your provider as well.   To learn more about what you can do with MyChart, go to [email or web address].    Your next appointment:   6 month(s)  The format for your next appointment:   In Person  Provider:   Donato Heinz, MD     Other Instructions You have been referred to: Lipid clinic with pharmacist    Important Information About Sugar

## 2022-07-03 LAB — LIPID PANEL
Chol/HDL Ratio: 3.6 ratio (ref 0.0–4.4)
Cholesterol, Total: 250 mg/dL — ABNORMAL HIGH (ref 100–199)
HDL: 70 mg/dL (ref >39)
LDL Chol Calc (NIH): 157 mg/dL — ABNORMAL HIGH (ref 0–99)
Triglycerides: 130 mg/dL (ref 0–149)
VLDL Cholesterol Cal: 23 mg/dL (ref 5–40)

## 2022-07-09 ENCOUNTER — Telehealth (HOSPITAL_COMMUNITY): Payer: Self-pay

## 2022-07-09 NOTE — Telephone Encounter (Signed)
Spoke with the patient, detailed instructions given. She stated that she would be here for her test. Asked to call back with any questions. S.Cordell Coke EMTP 

## 2022-07-10 ENCOUNTER — Ambulatory Visit (HOSPITAL_COMMUNITY): Payer: PPO | Attending: Internal Medicine

## 2022-07-10 DIAGNOSIS — R079 Chest pain, unspecified: Secondary | ICD-10-CM | POA: Insufficient documentation

## 2022-07-10 DIAGNOSIS — I2510 Atherosclerotic heart disease of native coronary artery without angina pectoris: Secondary | ICD-10-CM | POA: Diagnosis not present

## 2022-07-10 LAB — MYOCARDIAL PERFUSION IMAGING
LV dias vol: 49 mL (ref 46–106)
LV sys vol: 9 mL
Nuc Stress EF: 81 %
Peak HR: 108 {beats}/min
Rest HR: 65 {beats}/min
Rest Nuclear Isotope Dose: 10.2 mCi
SDS: 0
SRS: 0
SSS: 0
ST Depression (mm): 0 mm
Stress Nuclear Isotope Dose: 32.6 mCi
TID: 0.99

## 2022-07-10 MED ORDER — REGADENOSON 0.4 MG/5ML IV SOLN
0.4000 mg | Freq: Once | INTRAVENOUS | Status: AC
  Administered 2022-07-10: 0.4 mg via INTRAVENOUS

## 2022-07-10 MED ORDER — TECHNETIUM TC 99M TETROFOSMIN IV KIT
32.6000 | PACK | Freq: Once | INTRAVENOUS | Status: AC | PRN
  Administered 2022-07-10: 32.6 via INTRAVENOUS

## 2022-07-10 MED ORDER — TECHNETIUM TC 99M TETROFOSMIN IV KIT
10.2000 | PACK | Freq: Once | INTRAVENOUS | Status: AC | PRN
  Administered 2022-07-10: 10.2 via INTRAVENOUS

## 2022-07-28 ENCOUNTER — Ambulatory Visit: Payer: PPO | Attending: Cardiovascular Disease | Admitting: Pharmacist Clinician (PhC)/ Clinical Pharmacy Specialist

## 2022-07-28 ENCOUNTER — Encounter: Payer: Self-pay | Admitting: Pharmacist Clinician (PhC)/ Clinical Pharmacy Specialist

## 2022-07-28 DIAGNOSIS — G720 Drug-induced myopathy: Secondary | ICD-10-CM | POA: Diagnosis not present

## 2022-07-28 DIAGNOSIS — E785 Hyperlipidemia, unspecified: Secondary | ICD-10-CM | POA: Diagnosis not present

## 2022-07-28 DIAGNOSIS — T466X5A Adverse effect of antihyperlipidemic and antiarteriosclerotic drugs, initial encounter: Secondary | ICD-10-CM | POA: Diagnosis not present

## 2022-07-28 MED ORDER — EZETIMIBE 10 MG PO TABS: 10.0000 mg | ORAL_TABLET | Freq: Every day | ORAL | 3 refills | Status: DC

## 2022-07-28 NOTE — Patient Instructions (Signed)
Your Results:             Your most recent labs Goal  Total Cholesterol 250 < 200  Triglycerides 130 < 150  HDL (happy/good cholesterol) 70 > 40  LDL (lousy/bad cholesterol 157 < 70   Medication changes:  Start ezetimibe 10 mg once daily.  Try eating oatmeal or almonds to also help lower cholesterol    Lab orders:  We want to repeat labs after 2-3 months.  We will send you a lab order to remind you once we get closer to that time.      Thank you for choosing [facility]

## 2022-07-28 NOTE — Assessment & Plan Note (Signed)
Patient with hyperlipidemia, LDL not at goal and unable to tolerate 3 different statin drugs.  Reviewed options for lowering LDL cholesterol, including ezetimibe, PCSK-9 inhibitors, bempedoic acid and inclisiran.  Discussed mechanisms of action, dosing, side effects and potential decreases in LDL cholesterol.  Also reviewed cost information and potential options for patient assistance.  Answered all patient questions.  Based on this information, patient would prefer to start ezetimibe 10 mg daily.  She is not interested in medications with higher copays at this time.  She was encouraged to add oatmeal and almonds to her diet for dietary benefits and we will repeat cholesterol labs in 3 months.

## 2022-07-28 NOTE — Progress Notes (Signed)
07/28/2022 Taylor Jacobs August 20, 1935 [id number]   HPI:  Taylor Jacobs is a 86 y.o. female patient of Dr Gardiner Rhyme, who presents today for a lipid clinic evaluation.  See pertinent past medical history below.  In 2021 she presented to the ED with chest pain, but cardiac workup was negative.  She has continue to have occasional chest pain, but there has been no relationship to exertion.  Dr. Gardiner Rhyme saw her last month and was taking pravastatin at the time, but quit shortly thereafter, secondary to ongoing myalgias.  She is in the office today to discuss further options.    Past Medical History: SVT Paroxysmal, wore Zio patch, 14 days, had 105 episodes, longest 16 seconds, asymptomatic   Pre-diabetes 4/23 A1c 6.3  CAD Atypical angina, calcium score 0, non-calcified plaque causing 50-69% stenosis of proximal RCA    Current Medications: pravastatin 20 mg qd  Cholesterol Goals: LDL < 70   Intolerant/previously tried: atorvastatin, rosuvastatin, pravastatin - myalgias  Family history: father had MI died later at 23 emphysema, mother with aortic repair, smoker, died at 70; sister died cancer, oldest son with pacemaker, other two healthy  Diet: more home cooked, adds some salt with cooking  Exercise:  looks after husband, unable to get out much   Labs: 9/23 TC 250, TG 130, HDL 70, LDL 157 (on pravastatin 20?)  Insurance:  [employer] Current Outpatient Medications  Medication Sig Dispense Refill   ezetimibe (ZETIA) 10 MG tablet Take 1 tablet (10 mg total) by mouth daily. 90 tablet 3   acetaminophen (TYLENOL) 500 MG tablet Take 1,000 mg by mouth every 6 (six) hours as needed (For headache, pain or fever.).     albuterol (VENTOLIN HFA) 108 (90 Base) MCG/ACT inhaler SMARTSIG:2 Puff(s) By Mouth Every 4 Hours PRN     Cholecalciferol (VITAMIN D3) 2000 units TABS Take 2,000 Units by mouth daily.     Cyanocobalamin (VITAMIN B12) 1000 MCG TBCR 1 tablet     dexlansoprazole  (DEXILANT) 60 MG capsule Take 60 mg by mouth daily.     guaiFENesin (MUCINEX) 600 MG 12 hr tablet 1 tablet as needed     Omega-3 Fatty Acids (FISH OIL) 1000 MG CAPS 1 capsule (Patient not taking: Reported on 07/02/2022)     pravastatin (PRAVACHOL) 20 MG tablet Take 1 tablet (20 mg total) by mouth every evening. 90 tablet 3   vitamin C (ASCORBIC ACID) 500 MG tablet Take 1,000 mg by mouth 2 (two) times daily.     No current facility-administered medications for this visit.    Allergies  Allergen Reactions   Atorvastatin Other (See Comments)    Leg pain   Betadine [Povidone Iodine] Itching   Sulfa Antibiotics Other (See Comments)    Reaction unknown    Past Medical History:  Diagnosis Date   GERD (gastroesophageal reflux disease)    Headache    Heart murmur     There were no vitals taken for this visit.   Hyperlipidemia Patient with hyperlipidemia, LDL not at goal and unable to tolerate 3 different statin drugs.  Reviewed options for lowering LDL cholesterol, including ezetimibe, PCSK-9 inhibitors, bempedoic acid and inclisiran.  Discussed mechanisms of action, dosing, side effects and potential decreases in LDL cholesterol.  Also reviewed cost information and potential options for patient assistance.  Answered all patient questions.  Based on this information, patient would prefer to start ezetimibe 10 mg daily.  She is not interested in medications with higher copays at  this time.  She was encouraged to add oatmeal and almonds to her diet for dietary benefits and we will repeat cholesterol labs in 3 months.     Tommy Medal PharmD CPP [facility] [address] [place], [place] [address] [phone]

## 2022-08-04 DIAGNOSIS — Z23 Encounter for immunization: Secondary | ICD-10-CM | POA: Diagnosis not present

## 2022-08-04 DIAGNOSIS — K449 Diaphragmatic hernia without obstruction or gangrene: Secondary | ICD-10-CM | POA: Diagnosis not present

## 2022-08-04 DIAGNOSIS — I2510 Atherosclerotic heart disease of native coronary artery without angina pectoris: Secondary | ICD-10-CM | POA: Diagnosis not present

## 2022-08-04 DIAGNOSIS — R7303 Prediabetes: Secondary | ICD-10-CM | POA: Diagnosis not present

## 2022-08-04 DIAGNOSIS — E782 Mixed hyperlipidemia: Secondary | ICD-10-CM | POA: Diagnosis not present

## 2022-09-25 DIAGNOSIS — Z1231 Encounter for screening mammogram for malignant neoplasm of breast: Secondary | ICD-10-CM | POA: Diagnosis not present

## 2022-09-29 ENCOUNTER — Ambulatory Visit: Payer: PPO | Admitting: Cardiology

## 2022-11-09 ENCOUNTER — Other Ambulatory Visit: Payer: Self-pay | Admitting: Pharmacist Clinician (PhC)/ Clinical Pharmacy Specialist

## 2022-11-09 DIAGNOSIS — E785 Hyperlipidemia, unspecified: Secondary | ICD-10-CM

## 2022-11-25 ENCOUNTER — Other Ambulatory Visit: Payer: Self-pay | Admitting: Pharmacist Clinician (PhC)/ Clinical Pharmacy Specialist

## 2022-11-25 DIAGNOSIS — E785 Hyperlipidemia, unspecified: Secondary | ICD-10-CM

## 2023-02-16 NOTE — Progress Notes (Unsigned)
Cardiology Office Note:    Date:  02/19/2023   ID:  Taylor Jacobs, DOB May 14, 1935, MRN [id number]  PCP:  Lupita Raider, MD   [facility] Providers Cardiologist:  Little Ishikawa, MD Cardiology APP:  Marcelino Duster, [place] { Referring MD: Lupita Raider, MD   Chief Complaint  Patient presents with   Follow-up    History of Present Illness:    AANVI Jacobs is a 88 y.o. female with a hx of GERD, reported MVP, hiatal hernia, hyperlipidemia, and prediabetes.  She established with cardiology for chest pain.  Coronary CTA on 04/06/2020 showed a calcium score of 0, nonobstructive CAD with noncalcified plaque in the proximal RCA, but FFR 0.92.  Echo at the same time shows normal biventricular function and mild to moderate MR and mild AS.  Zio patch x3 days on 04/12/2020 showed episodes of SVT longest episode lasting 19 beats.  She was seen by Dr. Bjorn Pippin on 05/16/2020.  She was doing well at that time.  She was working as an Ship broker at [employer].  And while she was no longer walking 30 minutes a day as she was pre-pandemic, she does walk up and down the aisles at the [facility].  I saw her for follow-up 09/18/2021.  She stopped her statin in order to take Paxlovid for COVID she thinks she acquired while while working the [employer].  Well pausing statin, she had complete resolution of her leg pain.  She was referred to lipid clinic.  She was having palpitations at night at that time, but they were not particularly bothersome and her heart rate was in the 60s, I opted to not start a beta-blocker.  She was seen in clinic 07/02/2022 by Dr. Bjorn Pippin and reported chest pain about once per week described as a burning. Since calcium score zero with noncalcified plaque in the RCA with negative FFR. She underwent nuclear stress test which was negative for ischemia.   She presents today for routine follow up. She thinks her chest discomfort is related to GERD and stress. She is taking  care of her 58 yo husband. Her daughter and granddaughter living with her that is slightly stressful. She walks to the mailbox and through [employer] without CP or DOE.   Her husband served in the [employer] in [place].  She may be starting to have mild dementia. Will watch this.  Past Medical History:  Diagnosis Date   GERD (gastroesophageal reflux disease)    Headache    Heart murmur     Past Surgical History:  Procedure Laterality Date   ABDOMINAL HYSTERECTOMY     partial   CESAREAN SECTION     x 3   COLONOSCOPY WITH PROPOFOL N/A 12/04/2015   Procedure: COLONOSCOPY WITH PROPOFOL;  Surgeon: Charolett Bumpers, MD;  Location: [facility];  Service: Endoscopy;  Laterality: N/A;   ESOPHAGOGASTRODUODENOSCOPY (EGD) WITH PROPOFOL N/A 12/04/2015   Procedure: ESOPHAGOGASTRODUODENOSCOPY (EGD) WITH PROPOFOL;  Surgeon: Charolett Bumpers, MD;  Location: [facility];  Service: Endoscopy;  Laterality: N/A;   EYE SURGERY     bilateral cataract     Current Medications: Current Meds  Medication Sig   acetaminophen (TYLENOL) 500 MG tablet Take 1,000 mg by mouth every 6 (six) hours as needed (For headache, pain or fever.).   albuterol (VENTOLIN HFA) 108 (90 Base) MCG/ACT inhaler SMARTSIG:2 Puff(s) By Mouth Every 4 Hours PRN   dexlansoprazole (DEXILANT) 60 MG capsule Take 60 mg by mouth daily.  guaiFENesin (MUCINEX) 600 MG 12 hr tablet 1 tablet as needed   Omega-3 Fatty Acids (FISH OIL) 1000 MG CAPS    oxybutynin (DITROPAN) 5 MG tablet Take 5 mg by mouth 2 (two) times daily.   [DISCONTINUED] Cholecalciferol (VITAMIN D3) 2000 units TABS Take 2,000 Units by mouth daily.   [DISCONTINUED] ezetimibe (ZETIA) 10 MG tablet Take 1 tablet (10 mg total) by mouth daily.   [DISCONTINUED] vitamin C (ASCORBIC ACID) 500 MG tablet Take 1,000 mg by mouth 2 (two) times daily.     Allergies:   Atorvastatin, Betadine [povidone iodine], and Sulfa antibiotics   Social History   Socioeconomic History   Marital status:  Married    Spouse name: Not on file   Number of children: Not on file   Years of education: Not on file   Highest education level: Not on file  Occupational History   Not on file  Tobacco Use   Smoking status: Never   Smokeless tobacco: Never  Substance and Sexual Activity   Alcohol use: No   Drug use: No   Sexual activity: Not on file  Other Topics Concern   Not on file  Social History Narrative   Not on file   Social Determinants of Health   Financial Resource Strain: Not on file  Food Insecurity: Not on file  Transportation Needs: Not on file  Physical Activity: Not on file  Stress: Not on file  Social Connections: Not on file     Family History: The patient's family history is not on file.  ROS:   Please see the history of present illness.     All other systems reviewed and are negative.  EKGs/Labs/Other Studies Reviewed:    The following studies were reviewed today:  Nuclear stress test 06/2022:   Findings are consistent with no prior ischemia. The study is low risk.   No ST deviation was noted.   Left ventricular function is normal. Nuclear stress EF: 81 %. The left ventricular ejection fraction is hyperdynamic (>65%). End diastolic cavity size is normal. End systolic cavity size is normal.   Prior study not available for comparison.   IMPRESSIONS Negative for stress induced arrhythmias.  Stress ECG nondiagnostic due to pharmacologic protocol. Normal left ventricular function and size. Diaphragmatic attenuation.  Small apical defect and rest that improves with stress with normal wall motion that is likely artifact.   CONCLUSIONS Negative stress test. Low risk study.    Echo 04/2021: 1. Left ventricular ejection fraction, by estimation, is 60 to 65%. The  left ventricle has normal function. The left ventricle has no regional  wall motion abnormalities. Left ventricular diastolic parameters are  consistent with Grade I diastolic  dysfunction (impaired  relaxation).   2. Right ventricular systolic function is normal. The right ventricular  size is normal. Tricuspid regurgitation signal is inadequate for assessing  PA pressure.   3. The mitral valve is normal in structure. Mild mitral valve  regurgitation. No evidence of mitral stenosis. Moderate mitral annular  calcification.   4. The aortic valve is tricuspid. Aortic valve regurgitation is not  visualized. Mild aortic valve stenosis.   5. The inferior vena cava is normal in size with greater than 50%  respiratory variability, suggesting right atrial pressure of 3 mmHg.      EKG:  EKG is  ordered today.  The ekg ordered today demonstrates sinus tachycardia with HR 103 septal infarct - old, frequent PVC  Recent Labs: No results found for requested  labs within last 365 days.  Recent Lipid Panel    Component Value Date/Time   CHOL 250 (H) 07/02/2022 1537   TRIG 130 07/02/2022 1537   HDL 70 07/02/2022 1537   CHOLHDL 3.6 07/02/2022 1537   LDLCALC 157 (H) 07/02/2022 1537     Risk Assessment/Calculations:                Physical Exam:    VS:  BP 112/72 (BP Location: Left Arm, Patient Position: Sitting, Cuff Size: Normal)   Pulse 92   Ht 4\' 11"  (1.499 m)   Wt 121 lb 3.2 oz (55 kg)   BMI 24.48 kg/m     Wt Readings from Last 3 Encounters:  02/19/23 121 lb 3.2 oz (55 kg)  07/10/22 120 lb (54.4 kg)  07/02/22 120 lb 9.6 oz (54.7 kg)     GEN:  Well nourished, well developed in no acute distress HEENT: Normal NECK: No JVD; No carotid bruits LYMPHATICS: No lymphadenopathy CARDIAC: irregular rhythm, regular rate, no murmur RESPIRATORY:  Clear to auscultation without rales, wheezing or rhonchi  ABDOMEN: Soft, non-tender, non-distended MUSCULOSKELETAL:  No edema; No deformity  SKIN: Warm and dry NEUROLOGIC:  Alert and oriented x 3 PSYCHIATRIC:  Normal affect   ASSESSMENT:    1. Hyperlipidemia, unspecified hyperlipidemia type   2. CAD in native artery   3. Statin  myopathy   4. Statin intolerance   5. Coronary artery disease involving native coronary artery of native heart without angina pectoris   6. Palpitations   7. Paroxysmal SVT (supraventricular tachycardia)   8. PVC (premature ventricular contraction)    PLAN:    In order of problems listed above:   Mild MR/mild AS Repeat an echo 2022 with stable valvular disease, moderate MAC No syncope, no SOB or lower extremity swelling Will monitor clinically   Hyperlipidemia Statin intolerant 07/02/2022: Cholesterol, Total 250; HDL 70; LDL Chol Calc (NIH) 157; Triglycerides 130 Seen by lipid clinic pharmD 07/2022 and was started on zetia Needs repeat lipid panel  -will get this today   CAD CCTA for chest pain showed calcium score 0, nonobstructive CAD, noncalcified plaque in the RCA Nuclear stress test 06/2022 for chest pain was nonischemic Continue zetia, risk factor modification   Palpitations Paroxysmal SVT She was asymptomatic during SVT on heart monitor    Due to what sounded like bigeminy on exam, I obtained an echocardiogram which showed PVCs - I also observed a rhythm strip on the screen.     Follow up in 6 months.  Medication Adjustments/Labs and Tests Ordered: Current medicines are reviewed at length with the patient today.  Concerns regarding medicines are outlined above.  Orders Placed This Encounter  Procedures   EKG 12-Lead   Meds ordered this encounter  Medications   ezetimibe (ZETIA) 10 MG tablet    Sig: Take 1 tablet (10 mg total) by mouth daily.    Dispense:  90 tablet    Refill:  3    Patient Instructions  Medication Instructions:  Your physician recommends that you continue on your current medications as directed. Please refer to the Current Medication list given to you today.  *If you need a refill on your cardiac medications before your next appointment, please call your pharmacy*   Lab Work: TODAY:  LIPID (PREVIOUSLY ORDERED)  If you have labs  (blood work) drawn today and your tests are completely normal, you will receive your results only by: MyChart Message (if you have MyChart) OR A  paper copy in the mail If you have any lab test that is abnormal or we need to change your treatment, we will call you to review the results.   Testing/Procedures: None ordered   Follow-Up: At [facility], you and your health needs are our priority.  As part of our continuing mission to provide you with exceptional heart care, we have created designated Provider Care Teams.  These Care Teams include your primary Cardiologist (physician) and Advanced Practice Providers (APPs -  Physician Assistants and Nurse Practitioners) who all work together to provide you with the care you need, when you need it.  We recommend signing up for the patient portal called "MyChart".  Sign up information is provided on this After Visit Summary.  MyChart is used to connect with patients for Virtual Visits (Telemedicine).  Patients are able to view lab/test results, encounter notes, upcoming appointments, etc.  Non-urgent messages can be sent to your provider as well.   To learn more about what you can do with MyChart, go to [email or web address].    Your next appointment:   6 month(s)  Provider:   Little Ishikawa, MD  or Micah Flesher, PA-C        Other Instructions   Signed, Marcelino Duster, [place]  02/19/2023 11:47 AM    [facility]

## 2023-02-19 ENCOUNTER — Ambulatory Visit: Payer: PPO | Attending: Physician Assistant | Admitting: Physician Assistant

## 2023-02-19 ENCOUNTER — Encounter: Payer: Self-pay | Admitting: Physician Assistant

## 2023-02-19 VITALS — BP 112/72 | HR 92 | Ht 59.0 in | Wt 121.2 lb

## 2023-02-19 DIAGNOSIS — T466X5A Adverse effect of antihyperlipidemic and antiarteriosclerotic drugs, initial encounter: Secondary | ICD-10-CM

## 2023-02-19 DIAGNOSIS — R002 Palpitations: Secondary | ICD-10-CM | POA: Diagnosis not present

## 2023-02-19 DIAGNOSIS — T466X5D Adverse effect of antihyperlipidemic and antiarteriosclerotic drugs, subsequent encounter: Secondary | ICD-10-CM | POA: Diagnosis not present

## 2023-02-19 DIAGNOSIS — I2510 Atherosclerotic heart disease of native coronary artery without angina pectoris: Secondary | ICD-10-CM

## 2023-02-19 DIAGNOSIS — I471 Supraventricular tachycardia, unspecified: Secondary | ICD-10-CM

## 2023-02-19 DIAGNOSIS — G720 Drug-induced myopathy: Secondary | ICD-10-CM

## 2023-02-19 DIAGNOSIS — Z789 Other specified health status: Secondary | ICD-10-CM

## 2023-02-19 DIAGNOSIS — I493 Ventricular premature depolarization: Secondary | ICD-10-CM | POA: Diagnosis not present

## 2023-02-19 DIAGNOSIS — E785 Hyperlipidemia, unspecified: Secondary | ICD-10-CM | POA: Diagnosis not present

## 2023-02-19 MED ORDER — EZETIMIBE 10 MG PO TABS: 10.0000 mg | ORAL_TABLET | Freq: Every day | ORAL | 3 refills | Status: DC

## 2023-02-19 NOTE — Patient Instructions (Signed)
Medication Instructions:  Your physician recommends that you continue on your current medications as directed. Please refer to the Current Medication list given to you today.  *If you need a refill on your cardiac medications before your next appointment, please call your pharmacy*   Lab Work: TODAY:  LIPID (PREVIOUSLY ORDERED)  If you have labs (blood work) drawn today and your tests are completely normal, you will receive your results only by: MyChart Message (if you have MyChart) OR A paper copy in the mail If you have any lab test that is abnormal or we need to change your treatment, we will call you to review the results.   Testing/Procedures: None ordered   Follow-Up: At [facility], you and your health needs are our priority.  As part of our continuing mission to provide you with exceptional heart care, we have created designated Provider Care Teams.  These Care Teams include your primary Cardiologist (physician) and Advanced Practice Providers (APPs -  Physician Assistants and Nurse Practitioners) who all work together to provide you with the care you need, when you need it.  We recommend signing up for the patient portal called "MyChart".  Sign up information is provided on this After Visit Summary.  MyChart is used to connect with patients for Virtual Visits (Telemedicine).  Patients are able to view lab/test results, encounter notes, upcoming appointments, etc.  Non-urgent messages can be sent to your provider as well.   To learn more about what you can do with MyChart, go to [email or web address].    Your next appointment:   6 month(s)  Provider:   Little Ishikawa, MD  or Micah Flesher, PA-C        Other Instructions

## 2023-02-20 LAB — LIPID PANEL
Chol/HDL Ratio: 4.3 ratio (ref 0.0–4.4)
Cholesterol, Total: 211 mg/dL — ABNORMAL HIGH (ref 100–199)
HDL: 49 mg/dL (ref >39)
LDL Chol Calc (NIH): 127 mg/dL — ABNORMAL HIGH (ref 0–99)
Triglycerides: 198 mg/dL — ABNORMAL HIGH (ref 0–149)
VLDL Cholesterol Cal: 35 mg/dL (ref 5–40)

## 2023-02-24 ENCOUNTER — Telehealth: Payer: Self-pay | Admitting: Cardiology

## 2023-02-24 NOTE — Telephone Encounter (Signed)
Patient is calling requesting a callback with her lab results. She reports she does not use mychart, she had assistance with it last week while family was visiting, but they aren't here to walk her through it now. She is going with her husband to an appointment today, so she is requesting this callback around 1:00 or 2:00 pm today. She would also like a detailed VM left if she does not answer. Please advise.

## 2023-02-24 NOTE — Telephone Encounter (Signed)
Patient is aware of lab results. Verbalized understanding. 

## 2023-02-25 ENCOUNTER — Encounter: Payer: Self-pay | Admitting: *Deleted

## 2023-02-25 DIAGNOSIS — I471 Supraventricular tachycardia, unspecified: Secondary | ICD-10-CM | POA: Diagnosis not present

## 2023-02-25 DIAGNOSIS — M8580 Other specified disorders of bone density and structure, unspecified site: Secondary | ICD-10-CM | POA: Diagnosis not present

## 2023-02-25 DIAGNOSIS — N3281 Overactive bladder: Secondary | ICD-10-CM | POA: Diagnosis not present

## 2023-02-25 DIAGNOSIS — J42 Unspecified chronic bronchitis: Secondary | ICD-10-CM | POA: Diagnosis not present

## 2023-02-25 DIAGNOSIS — I2510 Atherosclerotic heart disease of native coronary artery without angina pectoris: Secondary | ICD-10-CM | POA: Diagnosis not present

## 2023-02-25 DIAGNOSIS — I340 Nonrheumatic mitral (valve) insufficiency: Secondary | ICD-10-CM | POA: Diagnosis not present

## 2023-02-25 DIAGNOSIS — K219 Gastro-esophageal reflux disease without esophagitis: Secondary | ICD-10-CM | POA: Diagnosis not present

## 2023-02-25 DIAGNOSIS — M150 Primary generalized (osteo)arthritis: Secondary | ICD-10-CM | POA: Diagnosis not present

## 2023-02-25 DIAGNOSIS — R7303 Prediabetes: Secondary | ICD-10-CM | POA: Diagnosis not present

## 2023-02-25 DIAGNOSIS — Z0000 Encounter for general adult medical examination without abnormal findings: Secondary | ICD-10-CM | POA: Diagnosis not present

## 2023-02-25 DIAGNOSIS — E559 Vitamin D deficiency, unspecified: Secondary | ICD-10-CM | POA: Diagnosis not present

## 2023-02-25 DIAGNOSIS — I700 Atherosclerosis of aorta: Secondary | ICD-10-CM | POA: Diagnosis not present

## 2023-03-17 DIAGNOSIS — M8588 Other specified disorders of bone density and structure, other site: Secondary | ICD-10-CM | POA: Diagnosis not present

## 2023-03-17 DIAGNOSIS — N951 Menopausal and female climacteric states: Secondary | ICD-10-CM | POA: Diagnosis not present

## 2023-06-10 DIAGNOSIS — H40013 Open angle with borderline findings, low risk, bilateral: Secondary | ICD-10-CM | POA: Diagnosis not present

## 2023-06-10 DIAGNOSIS — Z961 Presence of intraocular lens: Secondary | ICD-10-CM | POA: Diagnosis not present

## 2023-06-10 DIAGNOSIS — H353131 Nonexudative age-related macular degeneration, bilateral, early dry stage: Secondary | ICD-10-CM | POA: Diagnosis not present

## 2023-07-13 DIAGNOSIS — M9901 Segmental and somatic dysfunction of cervical region: Secondary | ICD-10-CM | POA: Diagnosis not present

## 2023-07-13 DIAGNOSIS — M9900 Segmental and somatic dysfunction of head region: Secondary | ICD-10-CM | POA: Diagnosis not present

## 2023-07-13 DIAGNOSIS — M9902 Segmental and somatic dysfunction of thoracic region: Secondary | ICD-10-CM | POA: Diagnosis not present

## 2023-07-13 DIAGNOSIS — M5032 Other cervical disc degeneration, mid-cervical region, unspecified level: Secondary | ICD-10-CM | POA: Diagnosis not present

## 2023-07-13 DIAGNOSIS — M5134 Other intervertebral disc degeneration, thoracic region: Secondary | ICD-10-CM | POA: Diagnosis not present

## 2023-07-13 DIAGNOSIS — M5136 Other intervertebral disc degeneration, lumbar region: Secondary | ICD-10-CM | POA: Diagnosis not present

## 2023-07-15 DIAGNOSIS — Z23 Encounter for immunization: Secondary | ICD-10-CM | POA: Diagnosis not present

## 2023-07-26 NOTE — Progress Notes (Unsigned)
Cardiology Office Note:    Date:  07/27/2023   ID:  MEDIA PIZZINI, DOB September 05, 1935, MRN [id number]  PCP:  Lupita Raider, MD   [facility] Providers Cardiologist:  Little Ishikawa, MD Cardiology APP:  Marcelino Duster, [place]     Referring MD: Lupita Raider, MD   Chief Complaint  Patient presents with   Follow-up    MVP    History of Present Illness:    Taylor Jacobs is a 88 y.o. female with a hx of GERD, reported MVP, hiatal hernia, hyperlipidemia, and prediabetes.  She established with cardiology for chest pain.  Coronary CTA on 04/06/2020 showed a calcium score of 0, nonobstructive CAD with noncalcified plaque in the proximal RCA, but FFR 0.92.  Echo at the same time shows normal biventricular function and mild to moderate MR and mild AS.  Zio patch x3 days on 04/12/2020 showed episodes of SVT longest episode lasting 19 beats.  She was seen by Dr. Bjorn Pippin on 05/16/2020.  She was doing well at that time.  She was working as an Ship broker at [employer].  And while she was no longer walking 30 minutes a day as she was pre-pandemic, she does walk up and down the aisles at the [employer].  I saw her for follow-up 09/18/2021.  She stopped her statin in order to take Paxlovid for COVID she thinks she acquired while while working the [employer].  While pausing statin, she had complete resolution of her leg pain.  She was referred to lipid clinic.  She was having palpitations at night at that time, but they were not particularly bothersome and her heart rate was in the 60s, I opted to not start a beta-blocker.  She was seen in clinic 07/02/2022 by Dr. Bjorn Pippin and reported chest pain about once per week described as a burning. Since calcium score zero with noncalcified plaque in the RCA with negative FFR. She underwent nuclear stress test which was negative for ischemia. I saw her in April 2024 for follow up - chest pain felt related to stress and GERD. We will watch possible mild  dementia.   Due to what sounded like bigeminy on exam, I observed a rhythm strips that showed PVCs.   She presents today for follow up. She now has three grand children ages 2 months to 67 months. She continues to take care for her husband. She plans to work early voting again. She is having pain along her spine and across her shoulders, I suspect this is MSK. I recommended Voltaren gel.    Past Medical History:  Diagnosis Date   GERD (gastroesophageal reflux disease)    Headache    Heart murmur     Past Surgical History:  Procedure Laterality Date   ABDOMINAL HYSTERECTOMY     partial   CESAREAN SECTION     x 3   COLONOSCOPY WITH PROPOFOL N/A 12/04/2015   Procedure: COLONOSCOPY WITH PROPOFOL;  Surgeon: Charolett Bumpers, MD;  Location: [facility];  Service: Endoscopy;  Laterality: N/A;   ESOPHAGOGASTRODUODENOSCOPY (EGD) WITH PROPOFOL N/A 12/04/2015   Procedure: ESOPHAGOGASTRODUODENOSCOPY (EGD) WITH PROPOFOL;  Surgeon: Charolett Bumpers, MD;  Location: [facility];  Service: Endoscopy;  Laterality: N/A;   EYE SURGERY     bilateral cataract     Current Medications: Current Meds  Medication Sig   acetaminophen (TYLENOL) 500 MG tablet Take 1,000 mg by mouth every 6 (six) hours as needed (For headache, pain or fever.).  albuterol (VENTOLIN HFA) 108 (90 Base) MCG/ACT inhaler SMARTSIG:2 Puff(s) By Mouth Every 4 Hours PRN   dexlansoprazole (DEXILANT) 60 MG capsule Take 60 mg by mouth daily.   ezetimibe (ZETIA) 10 MG tablet Take 1 tablet (10 mg total) by mouth daily.   guaiFENesin (MUCINEX) 600 MG 12 hr tablet 1 tablet as needed   Omega-3 Fatty Acids (FISH OIL) 1000 MG CAPS    oxybutynin (DITROPAN) 5 MG tablet Take 5 mg by mouth 2 (two) times daily.     Allergies:   Atorvastatin, Betadine [povidone iodine], and Sulfa antibiotics   Social History   Socioeconomic History   Marital status: Married    Spouse name: Not on file   Number of children: Not on file   Years of education:  Not on file   Highest education level: Not on file  Occupational History   Not on file  Tobacco Use   Smoking status: Never   Smokeless tobacco: Never  Substance and Sexual Activity   Alcohol use: No   Drug use: No   Sexual activity: Not on file  Other Topics Concern   Not on file  Social History Narrative   Not on file   Social Determinants of Health   Financial Resource Strain: Not on file  Food Insecurity: Not on file  Transportation Needs: Not on file  Physical Activity: Not on file  Stress: Not on file  Social Connections: Not on file     Family History: The patient's family history is not on file.  ROS:   Please see the history of present illness.     All other systems reviewed and are negative.  EKGs/Labs/Other Studies Reviewed:    The following studies were reviewed today:  Nuclear stress test 06/2022:   Findings are consistent with no prior ischemia. The study is low risk.   No ST deviation was noted.   Left ventricular function is normal. Nuclear stress EF: 81 %. The left ventricular ejection fraction is hyperdynamic (>65%). End diastolic cavity size is normal. End systolic cavity size is normal.   Prior study not available for comparison.   IMPRESSIONS Negative for stress induced arrhythmias.  Stress ECG nondiagnostic due to pharmacologic protocol. Normal left ventricular function and size. Diaphragmatic attenuation.  Small apical defect and rest that improves with stress with normal wall motion that is likely artifact.   CONCLUSIONS Negative stress test. Low risk study.     Echo 04/2021: 1. Left ventricular ejection fraction, by estimation, is 60 to 65%. The  left ventricle has normal function. The left ventricle has no regional  wall motion abnormalities. Left ventricular diastolic parameters are  consistent with Grade I diastolic  dysfunction (impaired relaxation).   2. Right ventricular systolic function is normal. The right ventricular  size  is normal. Tricuspid regurgitation signal is inadequate for assessing  PA pressure.   3. The mitral valve is normal in structure. Mild mitral valve  regurgitation. No evidence of mitral stenosis. Moderate mitral annular  calcification.   4. The aortic valve is tricuspid. Aortic valve regurgitation is not  visualized. Mild aortic valve stenosis.   5. The inferior vena cava is normal in size with greater than 50%  respiratory variability, suggesting right atrial pressure of 3 mmHg.   EKG Interpretation Date/Time:  Monday July 27 2023 11:53:08 EDT Ventricular Rate:  68 PR Interval:  156 QRS Duration:  106 QT Interval:  386 QTC Calculation: 410 R Axis:   -51  Text Interpretation: Normal  sinus rhythm with sinus arrhythmia Incomplete right bundle branch block Left anterior fascicular block Minimal voltage criteria for LVH, may be normal variant ( Cornell product ) When compared with ECG of 08-Nov-2019 10:49, PREVIOUS ECG IS PRESENT Confirmed by Micah Flesher ([id number]) on 07/27/2023 12:15:37 PM    Recent Labs: No results found for requested labs within last 365 days.  Recent Lipid Panel    Component Value Date/Time   CHOL 211 (H) 02/19/2023 1105   TRIG 198 (H) 02/19/2023 1105   HDL 49 02/19/2023 1105   CHOLHDL 4.3 02/19/2023 1105   LDLCALC 127 (H) 02/19/2023 1105     Risk Assessment/Calculations:                Physical Exam:    VS:  BP 110/68 (BP Location: Left Arm, Patient Position: Sitting, Cuff Size: Normal)   Pulse 68   Ht 4\' 11"  (1.499 m)   Wt 122 lb (55.3 kg)   BMI 24.64 kg/m     Wt Readings from Last 3 Encounters:  07/27/23 122 lb (55.3 kg)  02/19/23 121 lb 3.2 oz (55 kg)  07/10/22 120 lb (54.4 kg)     GEN:  Well nourished, well developed in no acute distress HEENT: Normal NECK: No JVD; No carotid bruits LYMPHATICS: No lymphadenopathy CARDIAC: RRR, no murmurs, rubs, gallops RESPIRATORY:  Clear to auscultation without rales, wheezing or rhonchi  ABDOMEN:  Soft, non-tender, non-distended MUSCULOSKELETAL:  No edema; No deformity  SKIN: Warm and dry NEUROLOGIC:  Alert and oriented x 3 PSYCHIATRIC:  Normal affect   ASSESSMENT:    1. Coronary artery disease involving native coronary artery of native heart without angina pectoris   2. Paroxysmal SVT (supraventricular tachycardia)   3. CAD in native artery   4. MVP (mitral valve prolapse)   5. Hyperlipidemia with target LDL less than 70   6. Statin intolerance   7. Acute midline back pain, unspecified back location   8. PVC (premature ventricular contraction)   9. Palpitations    PLAN:    In order of problems listed above:  Mild MR/mild AS Repeated  echo 2022 with stable valvular disease, moderate MAC Repeat in April 2025   Hyperlipidemia Statin intolerant Seen by lipid clinic pharmD 07/2022 and was started on zetia 02/19/2023: Cholesterol, Total 211; HDL 49; LDL Chol Calc (NIH) 127; Triglycerides 198   CAD CCTA for chest pain showed calcium score 0, nonobstructive CAD, noncalcified plaque in the RCA Nuclear stress test 06/2022 for chest pain was nonischemic Continue zieta, risk factor modification   Back and shoulder pain - I suspect MSK, she will call if no improvement or worsening, given CTA 2021, low suspicion for ACS   Palpitations Paroxysmal SVT PVCs She was asymptomatic during SVT on heart monitor but is now having palpitations at night.  She is having them weekly, but not particularly bothersome - PVCs on rhythm strip last visit     Follow up in 6 months after echo.       Medication Adjustments/Labs and Tests Ordered: Current medicines are reviewed at length with the patient today.  Concerns regarding medicines are outlined above.  Orders Placed This Encounter  Procedures   LDL cholesterol, direct   Lipid panel   EKG 12-Lead   ECHOCARDIOGRAM COMPLETE   No orders of the defined types were placed in this encounter.   Patient Instructions  Medication  Instructions:  NO CHANGES *If you need a refill on your cardiac medications before your next appointment, please  call your pharmacy*   Lab Work: LIPID PANEL AND DIRECT LDL TODAY. If you have labs (blood work) drawn today and your tests are completely normal, you will receive your results only by: MyChart Message (if you have MyChart) OR A paper copy in the mail If you have any lab test that is abnormal or we need to change your treatment, we will call you to review the results.   Testing/Procedures:[address] - MAY 2025 Your physician has requested that you have an echocardiogram. Echocardiography is a painless test that uses sound waves to create images of your heart. It provides your doctor with information about the size and shape of your heart and how well your heart's chambers and valves are working. This procedure takes approximately one hour. There are no restrictions for this procedure. Please do NOT wear cologne, perfume, aftershave, or lotions (deodorant is allowed). Please arrive 15 minutes prior to your appointment time.    Follow-Up: At [facility], you and your health needs are our priority.  As part of our continuing mission to provide you with exceptional heart care, we have created designated Provider Care Teams.  These Care Teams include your primary Cardiologist (physician) and Advanced Practice Providers (APPs -  Physician Assistants and Nurse Practitioners) who all work together to provide you with the care you need, when you need it.  Your next appointment:   2 week(s) after echocardiogram in May 2025  Provider:   Micah Flesher, PA-C   Signed, Roe Rutherford Lockridge, [place]  07/27/2023 1:34 PM    [facility]

## 2023-07-27 ENCOUNTER — Ambulatory Visit: Payer: PPO | Attending: Physician Assistant | Admitting: Physician Assistant

## 2023-07-27 ENCOUNTER — Encounter: Payer: Self-pay | Admitting: Physician Assistant

## 2023-07-27 VITALS — BP 110/68 | HR 68 | Ht 59.0 in | Wt 122.0 lb

## 2023-07-27 DIAGNOSIS — I2510 Atherosclerotic heart disease of native coronary artery without angina pectoris: Secondary | ICD-10-CM

## 2023-07-27 DIAGNOSIS — E785 Hyperlipidemia, unspecified: Secondary | ICD-10-CM

## 2023-07-27 DIAGNOSIS — I471 Supraventricular tachycardia, unspecified: Secondary | ICD-10-CM | POA: Diagnosis not present

## 2023-07-27 DIAGNOSIS — Z789 Other specified health status: Secondary | ICD-10-CM

## 2023-07-27 DIAGNOSIS — R002 Palpitations: Secondary | ICD-10-CM | POA: Diagnosis not present

## 2023-07-27 DIAGNOSIS — I493 Ventricular premature depolarization: Secondary | ICD-10-CM | POA: Diagnosis not present

## 2023-07-27 DIAGNOSIS — M549 Dorsalgia, unspecified: Secondary | ICD-10-CM | POA: Diagnosis not present

## 2023-07-27 DIAGNOSIS — I341 Nonrheumatic mitral (valve) prolapse: Secondary | ICD-10-CM

## 2023-07-27 NOTE — Patient Instructions (Signed)
Medication Instructions:  NO CHANGES *If you need a refill on your cardiac medications before your next appointment, please call your pharmacy*   Lab Work: LIPID PANEL AND DIRECT LDL TODAY. If you have labs (blood work) drawn today and your tests are completely normal, you will receive your results only by: MyChart Message (if you have MyChart) OR A paper copy in the mail If you have any lab test that is abnormal or we need to change your treatment, we will call you to review the results.   Testing/Procedures:[address] - MAY 2025 Your physician has requested that you have an echocardiogram. Echocardiography is a painless test that uses sound waves to create images of your heart. It provides your doctor with information about the size and shape of your heart and how well your heart's chambers and valves are working. This procedure takes approximately one hour. There are no restrictions for this procedure. Please do NOT wear cologne, perfume, aftershave, or lotions (deodorant is allowed). Please arrive 15 minutes prior to your appointment time.    Follow-Up: At [facility], you and your health needs are our priority.  As part of our continuing mission to provide you with exceptional heart care, we have created designated Provider Care Teams.  These Care Teams include your primary Cardiologist (physician) and Advanced Practice Providers (APPs -  Physician Assistants and Nurse Practitioners) who all work together to provide you with the care you need, when you need it.  Your next appointment:   2 week(s) after echocardiogram in May 2025  Provider:   Micah Flesher, PA-C

## 2023-07-28 LAB — LIPID PANEL
Chol/HDL Ratio: 3.7 {ratio} (ref 0.0–4.4)
Cholesterol, Total: 194 mg/dL (ref 100–199)
HDL: 53 mg/dL (ref >39)
LDL Chol Calc (NIH): 111 mg/dL — ABNORMAL HIGH (ref 0–99)
Triglycerides: 173 mg/dL — ABNORMAL HIGH (ref 0–149)
VLDL Cholesterol Cal: 30 mg/dL (ref 5–40)

## 2023-07-28 LAB — LDL CHOLESTEROL, DIRECT: LDL Direct: 118 mg/dL — ABNORMAL HIGH (ref 0–99)

## 2023-07-29 ENCOUNTER — Telehealth: Payer: Self-pay

## 2023-07-29 NOTE — Telephone Encounter (Signed)
Pt returned call. Pt was notified of lab results and will continue current medication as directed.

## 2023-07-29 NOTE — Telephone Encounter (Signed)
Attempted to contact pt with no answer. Not able to leave a VM. Will call pt back to discuss lab results.

## 2023-10-08 DIAGNOSIS — Z1231 Encounter for screening mammogram for malignant neoplasm of breast: Secondary | ICD-10-CM | POA: Diagnosis not present

## 2024-02-26 DIAGNOSIS — E559 Vitamin D deficiency, unspecified: Secondary | ICD-10-CM | POA: Diagnosis not present

## 2024-02-26 DIAGNOSIS — J42 Unspecified chronic bronchitis: Secondary | ICD-10-CM | POA: Diagnosis not present

## 2024-02-26 DIAGNOSIS — R7303 Prediabetes: Secondary | ICD-10-CM | POA: Diagnosis not present

## 2024-02-26 DIAGNOSIS — I471 Supraventricular tachycardia, unspecified: Secondary | ICD-10-CM | POA: Diagnosis not present

## 2024-02-26 DIAGNOSIS — Z0000 Encounter for general adult medical examination without abnormal findings: Secondary | ICD-10-CM | POA: Diagnosis not present

## 2024-02-26 DIAGNOSIS — M150 Primary generalized (osteo)arthritis: Secondary | ICD-10-CM | POA: Diagnosis not present

## 2024-02-26 DIAGNOSIS — N3281 Overactive bladder: Secondary | ICD-10-CM | POA: Diagnosis not present

## 2024-02-26 DIAGNOSIS — I340 Nonrheumatic mitral (valve) insufficiency: Secondary | ICD-10-CM | POA: Diagnosis not present

## 2024-02-26 DIAGNOSIS — I2510 Atherosclerotic heart disease of native coronary artery without angina pectoris: Secondary | ICD-10-CM | POA: Diagnosis not present

## 2024-02-26 DIAGNOSIS — K219 Gastro-esophageal reflux disease without esophagitis: Secondary | ICD-10-CM | POA: Diagnosis not present

## 2024-02-26 DIAGNOSIS — Z1331 Encounter for screening for depression: Secondary | ICD-10-CM | POA: Diagnosis not present

## 2024-02-26 DIAGNOSIS — E782 Mixed hyperlipidemia: Secondary | ICD-10-CM | POA: Diagnosis not present

## 2024-02-26 LAB — LAB REPORT - SCANNED
A1c: 6.3
EGFR: 86

## 2024-03-02 ENCOUNTER — Ambulatory Visit (HOSPITAL_COMMUNITY): Payer: PPO | Attending: Cardiology

## 2024-03-02 DIAGNOSIS — I341 Nonrheumatic mitral (valve) prolapse: Secondary | ICD-10-CM | POA: Insufficient documentation

## 2024-03-02 LAB — ECHOCARDIOGRAM COMPLETE
AR max vel: 1.51 cm2
AV Area VTI: 1.55 cm2
AV Area mean vel: 1.31 cm2
AV Mean grad: 9 mmHg
AV Peak grad: 15.4 mmHg
Ao pk vel: 1.97 m/s
Area-P 1/2: 3.2 cm2
S' Lateral: 2.5 cm

## 2024-03-03 ENCOUNTER — Encounter: Payer: Self-pay | Admitting: *Deleted

## 2024-03-08 ENCOUNTER — Ambulatory Visit: Payer: Self-pay

## 2024-03-28 NOTE — Progress Notes (Signed)
 Cardiology Office Note:    Date:  04/07/2024   ID:  Taylor Jacobs, DOB November 08, 1934, MRN [id number]  PCP:  Glena Landau, MD   [facility] Providers Cardiologist:  Wendie Hamburg, MD Cardiology APP:  Lamond Pilot, [place]     Referring MD: Glena Landau, MD   Chief Complaint  Patient presents with   Follow-up    History of Present Illness:    Taylor Jacobs is a 89 y.o. female with a hx of GERD, reported MVP, hiatal hernia, hyperlipidemia, and prediabetes.  She established with cardiology for chest pain.  Coronary CTA on 04/06/2020 showed a calcium  score of 0, nonobstructive CAD with noncalcified plaque in the proximal RCA, but FFR 0.92.  Echo at the same time shows normal biventricular function and mild to moderate MR and mild AS.  Zio patch x3 days on 04/12/2020 showed episodes of SVT longest episode lasting 19 beats.    I saw her for follow-up 09/18/2021.  She stopped her statin in order to take Paxlovid for COVID she thinks she acquired while while working the [employer].  While pausing statin, she had complete resolution of her leg pain.  She was referred to lipid clinic.  She was having palpitations at night at that time, but they were not particularly bothersome and her heart rate was in the 60s, I opted to not start a beta-blocker.  She was seen in clinic 07/02/2022 by Dr. Alda Amas and reported chest pain about once per week described as a burning. Since calcium  score zero with noncalcified plaque in the RCA with negative FFR. She underwent nuclear stress test which was negative for ischemia. I saw her in April 2024 for follow up - chest pain felt related to stress and GERD. We will watch possible mild dementia.   Due to what sounded like bigeminy on exam, I observed a rhythm strips that showed PVCs.   She presents today for follow up.  She remains busy with 3 grandchildren.  She describes new chest pain. She has CP at night when lying flat. She does not  notice the CP much when active during the day. She suspects this is GI/reflux in nature and I agree based on her description. She is already on a PPI and famotidine. She declines referral to GI.   She states her BP is high today, which is unusual, because she had to clean the oven yesterday after her daughter's coconut pie overflowed.   Past Medical History:  Diagnosis Date   GERD (gastroesophageal reflux disease)    Headache    Heart murmur     Past Surgical History:  Procedure Laterality Date   ABDOMINAL HYSTERECTOMY     partial   CESAREAN SECTION     x 3   COLONOSCOPY WITH PROPOFOL  N/A 12/04/2015   Procedure: COLONOSCOPY WITH PROPOFOL ;  Surgeon: Garrett Kallman, MD;  Location: [facility];  Service: Endoscopy;  Laterality: N/A;   ESOPHAGOGASTRODUODENOSCOPY (EGD) WITH PROPOFOL  N/A 12/04/2015   Procedure: ESOPHAGOGASTRODUODENOSCOPY (EGD) WITH PROPOFOL ;  Surgeon: Garrett Kallman, MD;  Location: [facility];  Service: Endoscopy;  Laterality: N/A;   EYE SURGERY     bilateral cataract     Current Medications: Current Meds  Medication Sig   acetaminophen (TYLENOL) 500 MG tablet Take 1,000 mg by mouth every 6 (six) hours as needed (For headache, pain or fever.).   albuterol  (VENTOLIN  HFA) 108 (90 Base) MCG/ACT inhaler SMARTSIG:2 Puff(s) By Mouth Every 4 Hours PRN  dexlansoprazole (DEXILANT) 60 MG capsule Take 60 mg by mouth daily.   ezetimibe  (ZETIA ) 10 MG tablet Take 1 tablet (10 mg total) by mouth daily.   guaiFENesin (MUCINEX) 600 MG 12 hr tablet 1 tablet as needed   oxybutynin (DITROPAN) 5 MG tablet Take 5 mg by mouth 2 (two) times daily. (Patient taking differently: Take 5 mg by mouth as needed.)   sucralfate  (CARAFATE ) 1 g tablet Take 1 tablet (1 g total) by mouth 4 (four) times daily -  with meals and at bedtime.     Allergies:   Atorvastatin , Betadine [povidone iodine], and Sulfa antibiotics   Social History   Socioeconomic History   Marital status: Married    Spouse  name: Not on file   Number of children: Not on file   Years of education: Not on file   Highest education level: Not on file  Occupational History   Not on file  Tobacco Use   Smoking status: Never   Smokeless tobacco: Never  Substance and Sexual Activity   Alcohol use: No   Drug use: No   Sexual activity: Not on file  Other Topics Concern   Not on file  Social History Narrative   Not on file   Social Drivers of Health   Financial Resource Strain: Not on file  Food Insecurity: Not on file  Transportation Needs: Not on file  Physical Activity: Not on file  Stress: Not on file  Social Connections: Not on file     Family History: The patient's family history is not on file.  ROS:   Please see the history of present illness.     All other systems reviewed and are negative.  EKGs/Labs/Other Studies Reviewed:    The following studies were reviewed today:       Recent Labs: No results found for requested labs within last 365 days.  Recent Lipid Panel    Component Value Date/Time   CHOL 194 07/27/2023 1255   TRIG 173 (H) 07/27/2023 1255   HDL 53 07/27/2023 1255   CHOLHDL 3.7 07/27/2023 1255   LDLCALC 111 (H) 07/27/2023 1255   LDLDIRECT 118 (H) 07/27/2023 1255     Risk Assessment/Calculations:           Physical Exam:    VS:  BP (!) 146/71   Pulse 72   Ht 4' 10 (1.473 m)   Wt 123 lb 9.6 oz (56.1 kg)   SpO2 96%   BMI 25.83 kg/m     Wt Readings from Last 3 Encounters:  04/07/24 123 lb 9.6 oz (56.1 kg)  07/27/23 122 lb (55.3 kg)  02/19/23 121 lb 3.2 oz (55 kg)     GEN:  Well nourished, well developed in no acute distress HEENT: Normal NECK: No JVD; No carotid bruits LYMPHATICS: No lymphadenopathy CARDIAC: RRR, no murmurs, rubs, gallops RESPIRATORY:  Clear to auscultation without rales, wheezing or rhonchi  ABDOMEN: Soft, non-tender, non-distended MUSCULOSKELETAL:  No edema; No deformity  SKIN: Warm and dry NEUROLOGIC:  Alert and oriented x  3 PSYCHIATRIC:  Normal affect   ASSESSMENT:    1. Primary hypertension   2. Gastroesophageal reflux disease, unspecified whether esophagitis present   3. Coronary artery disease involving native coronary artery of native heart without angina pectoris   4. MVP (mitral valve prolapse)   5. Hyperlipidemia with target LDL less than 70   6. Statin intolerance   7. Paroxysmal SVT (supraventricular tachycardia)    PLAN:    In  order of problems listed above:  Hypertension - she has never had HTN, BP generally 112 - she will keep a log at home - she does not want to start a new medicine if possible   Mild MR/mild AS Repeated  echo 2022 with stable valvular disease, moderate MAC Stable on echo 02/2024 - No plans for additional study at this time - Will follow clinically   Hyperlipidemia Statin intolerant Seen by lipid clinic pharmD 07/2022 and was started on zetia  07/27/2023: Cholesterol, Total 194; HDL 53; LDL Chol Calc (NIH) 111; Triglycerides 173   CAD CCTA for chest pain showed calcium  score 0, nonobstructive CAD, noncalcified plaque in the RCA Nuclear stress test 06/2022 for chest pain was nonischemic Continue zieta, risk factor modification   Palpitations Paroxysmal SVT PVCs She was asymptomatic during SVT on heart monitor but is now having palpitations at night.  She is having them weekly, but not particularly bothersome   GERD - chest pain sounds consistent with reflux flare - I will give 30 days of carafate  to see if this helps   Follow up in 6 months.          Medication Adjustments/Labs and Tests Ordered: Current medicines are reviewed at length with the patient today.  Concerns regarding medicines are outlined above.  No orders of the defined types were placed in this encounter.  Meds ordered this encounter  Medications   sucralfate  (CARAFATE ) 1 g tablet    Sig: Take 1 tablet (1 g total) by mouth 4 (four) times daily -  with meals and at bedtime.     Dispense:  120 tablet    Refill:  1    There are no Patient Instructions on file for this visit.   Signed, Lamond Pilot, [place]  04/07/2024 10:04 AM    [facility]

## 2024-03-29 ENCOUNTER — Other Ambulatory Visit: Payer: Self-pay

## 2024-03-29 MED ORDER — EZETIMIBE 10 MG PO TABS: 10.0000 mg | ORAL_TABLET | Freq: Every day | ORAL | 0 refills | Status: DC

## 2024-04-07 ENCOUNTER — Ambulatory Visit: Attending: Physician Assistant | Admitting: Physician Assistant

## 2024-04-07 ENCOUNTER — Encounter: Payer: Self-pay | Admitting: Physician Assistant

## 2024-04-07 VITALS — BP 146/71 | HR 72 | Ht <= 58 in | Wt 123.6 lb

## 2024-04-07 DIAGNOSIS — E785 Hyperlipidemia, unspecified: Secondary | ICD-10-CM | POA: Diagnosis not present

## 2024-04-07 DIAGNOSIS — I471 Supraventricular tachycardia, unspecified: Secondary | ICD-10-CM

## 2024-04-07 DIAGNOSIS — I10 Essential (primary) hypertension: Secondary | ICD-10-CM | POA: Diagnosis not present

## 2024-04-07 DIAGNOSIS — I341 Nonrheumatic mitral (valve) prolapse: Secondary | ICD-10-CM | POA: Diagnosis not present

## 2024-04-07 DIAGNOSIS — K219 Gastro-esophageal reflux disease without esophagitis: Secondary | ICD-10-CM

## 2024-04-07 DIAGNOSIS — I2510 Atherosclerotic heart disease of native coronary artery without angina pectoris: Secondary | ICD-10-CM

## 2024-04-07 DIAGNOSIS — Z789 Other specified health status: Secondary | ICD-10-CM | POA: Diagnosis not present

## 2024-04-07 MED ORDER — SUCRALFATE 1 G PO TABS: 1.0000 g | ORAL_TABLET | Freq: Three times a day (TID) | ORAL | 1 refills | Status: AC

## 2024-04-07 NOTE — Patient Instructions (Addendum)
 Medication Instructions:  Start Carafate  1 mg tablet as directed.   *If you need a refill on your cardiac medications before your next appointment, please call your pharmacy*  Lab Work: NONE ordered at this time of appointment   Testing/Procedures: NONE ordered at this time of appointment   Follow-Up: At [facility], you and your health needs are our priority.  As part of our continuing mission to provide you with exceptional heart care, our providers are all part of one team.  This team includes your primary Cardiologist (physician) and Advanced Practice Providers or APPs (Physician Assistants and Nurse Practitioners) who all work together to provide you with the care you need, when you need it.  Your next appointment:   6 month(s)  Provider:   Wendie Hamburg, MD or Marcie Sever, PA-C          We recommend signing up for the patient portal called MyChart.  Sign up information is provided on this After Visit Summary.  MyChart is used to connect with patients for Virtual Visits (Telemedicine).  Patients are able to view lab/test results, encounter notes, upcoming appointments, etc.  Non-urgent messages can be sent to your provider as well.   To learn more about what you can do with MyChart, go to [email or web address].

## 2024-06-15 DIAGNOSIS — H353131 Nonexudative age-related macular degeneration, bilateral, early dry stage: Secondary | ICD-10-CM | POA: Diagnosis not present

## 2024-06-15 DIAGNOSIS — H40013 Open angle with borderline findings, low risk, bilateral: Secondary | ICD-10-CM | POA: Diagnosis not present

## 2024-06-28 ENCOUNTER — Other Ambulatory Visit: Payer: Self-pay | Admitting: Physician Assistant

## 2024-06-29 MED ORDER — EZETIMIBE 10 MG PO TABS: 10.0000 mg | ORAL_TABLET | Freq: Every day | ORAL | 2 refills | Status: AC

## 2024-08-30 DIAGNOSIS — M170 Bilateral primary osteoarthritis of knee: Secondary | ICD-10-CM | POA: Diagnosis not present

## 2024-09-01 DIAGNOSIS — E782 Mixed hyperlipidemia: Secondary | ICD-10-CM | POA: Diagnosis not present

## 2024-09-01 DIAGNOSIS — I2510 Atherosclerotic heart disease of native coronary artery without angina pectoris: Secondary | ICD-10-CM | POA: Diagnosis not present

## 2024-09-01 DIAGNOSIS — K219 Gastro-esophageal reflux disease without esophagitis: Secondary | ICD-10-CM | POA: Diagnosis not present

## 2024-09-01 DIAGNOSIS — R7303 Prediabetes: Secondary | ICD-10-CM | POA: Diagnosis not present

## 2024-09-01 LAB — LAB REPORT - SCANNED
A1c: 6.5
EGFR: 85

## 2024-09-20 DIAGNOSIS — R5383 Other fatigue: Secondary | ICD-10-CM | POA: Diagnosis not present

## 2024-09-29 DIAGNOSIS — M170 Bilateral primary osteoarthritis of knee: Secondary | ICD-10-CM | POA: Diagnosis not present

## 2024-11-15 NOTE — Progress Notes (Unsigned)
 " Cardiology Office Note:    Date:  11/16/2024   ID:  Taylor Jacobs, DOB 10/31/1934, MRN [id number]  PCP:  Loreli Kins, MD  Cardiologist:  Lonni LITTIE Nanas, MD  Electrophysiologist:  None   Referring MD: Loreli Kins, MD   Chief Complaint  Patient presents with   Palpitations    History of Present Illness:    Taylor Jacobs is a 89 y.o. female with a hx of GERD, reported MVP, hiatal hernia, hyperlipidemia, prediabetes who presents for follow-up.  She was referred by Sydelle Close, PA for an evaluation of chest pain, initially seen on 03/14/2020.  She had ED visit with chest pain on 11/08/2019.  Work-up in the ED was negative, including EKG without ischemic changes, unremarkable chest x-ray, and troponins negative x2.    Coronary CTA was done 04/06/2020, which showed calcium  score 0, nonobstructive CAD with noncalcified plaque in proximal RCA causing 50 to 69% stenosis (CT FFR 0.92).  Echocardiogram on 04/05/2020 shows normal biventricular function, mild to moderate MR and mild AS. Zio patch x3 days on 04/12/2020 showed episodes of SVT, longest lasting 19 beats.  Zio patch x14 days on 12/13/2020 showed 105 episodes of SVT, longest lasting 16 seconds.  Echocardiogram 05/16/2021 showed normal biventricular function, mild aortic stenosis, mild mitral regurgitation.  Lexiscan  Myoview  06/2022 showed no ischemia, EF 81%.  Echocardiogram 02/2024 showed normal biventricular function, mild to moderate mitral regurgitation.  Since last clinic visit, she reports she is doing okay.  Reports no chest pain but does feel like her heart is racing, can last for at least 15 minutes.  She denies any dyspnea, lightheadedness, syncope, lower extremity edema.  She has not been exercising.  Reports BP in 120s to 130s when she checks at home.    Past Medical History:  Diagnosis Date   GERD (gastroesophageal reflux disease)    Headache    Heart murmur     Past Surgical History:  Procedure Laterality  Date   ABDOMINAL HYSTERECTOMY     partial   CESAREAN SECTION     x 3   COLONOSCOPY WITH PROPOFOL  N/A 12/04/2015   Procedure: COLONOSCOPY WITH PROPOFOL ;  Surgeon: Gladis MARLA Louder, MD;  Location: [facility];  Service: Endoscopy;  Laterality: N/A;   ESOPHAGOGASTRODUODENOSCOPY (EGD) WITH PROPOFOL  N/A 12/04/2015   Procedure: ESOPHAGOGASTRODUODENOSCOPY (EGD) WITH PROPOFOL ;  Surgeon: Gladis MARLA Louder, MD;  Location: [facility];  Service: Endoscopy;  Laterality: N/A;   EYE SURGERY     bilateral cataract     Current Medications: Current Meds  Medication Sig   carvedilol  (COREG ) 6.25 MG tablet Take 1 tablet (6.25 mg total) by mouth 2 (two) times daily.     Allergies:   Atorvastatin , Betadine [povidone iodine], and Sulfa antibiotics   Social History   Socioeconomic History   Marital status: Married    Spouse name: Not on file   Number of children: Not on file   Years of education: Not on file   Highest education level: Not on file  Occupational History   Not on file  Tobacco Use   Smoking status: Never   Smokeless tobacco: Never  Substance and Sexual Activity   Alcohol use: No   Drug use: No   Sexual activity: Not on file  Other Topics Concern   Not on file  Social History Narrative   Not on file   Social Drivers of Health   Tobacco Use: Low Risk (04/07/2024)   Patient History    Smoking Tobacco  Use: Never    Smokeless Tobacco Use: Never    Passive Exposure: Not on file  Financial Resource Strain: Not on file  Food Insecurity: Not on file  Transportation Needs: Not on file  Physical Activity: Not on file  Stress: Not on file  Social Connections: Not on file  Depression (EYV7-0): Not on file  Alcohol Screen: Not on file  Housing: Not on file  Utilities: Not on file  Health Literacy: Not on file     Family History: Mother had aortic aneurysm, had surgery at age 83.  Son has pacemaker, she is unsure of reason.  ROS:   Please see the history of present illness.     All other systems reviewed and are negative.  EKGs/Labs/Other Studies Reviewed:    The following studies were reviewed today:   EKG:   11/16/2024: Normal sinus rhythm, rate 73, left axis deviation, LVH 07/02/2022: Normal sinus rhythm, left axis deviation, rate 72, poor R wave progression 02/27/20: normal sinus rhythm, rate 69, no ST/T abnormalities, left axis deviation, poor R wave progression  Recent Labs: No results found for requested labs within last 365 days.  Recent Lipid Panel    Component Value Date/Time   CHOL 194 07/27/2023 1255   TRIG 173 (H) 07/27/2023 1255   HDL 53 07/27/2023 1255   CHOLHDL 3.7 07/27/2023 1255   LDLCALC 111 (H) 07/27/2023 1255   LDLDIRECT 118 (H) 07/27/2023 1255    Physical Exam:    VS:  BP (!) 163/75 (BP Location: Right Arm)   Pulse 73   Ht 4' 11 (1.499 m)   Wt 123 lb (55.8 kg)   SpO2 97%   BMI 24.84 kg/m     Wt Readings from Last 3 Encounters:  11/16/24 123 lb (55.8 kg)  04/07/24 123 lb 9.6 oz (56.1 kg)  07/27/23 122 lb (55.3 kg)     GEN: in no acute distress HEENT: Normal NECK: No JVD; No carotid bruits LYMPHATICS: No lymphadenopathy CARDIAC: RRR, 2 out of 6 systolic murmur RESPIRATORY:  Clear to auscultation without rales, wheezing or rhonchi  ABDOMEN: Soft, non-tender, non-distended MUSCULOSKELETAL:  No edema; SKIN: Warm and dry NEUROLOGIC:  Alert and oriented x 3 PSYCHIATRIC:  Normal affect   ASSESSMENT:    1. Coronary artery disease involving native coronary artery of native heart without angina pectoris   2. Paroxysmal SVT (supraventricular tachycardia)   3. Palpitations   4. Primary hypertension   5. Hyperlipidemia with target LDL less than 70   6. Mitral valve insufficiency, unspecified etiology      PLAN:    CAD: Reported atypical chest pain.  Coronary CTA was done 04/06/2020, which showed calcium  score 0, nonobstructive CAD with noncalcified plaque in proximal RCA causing 50 to 69% stenosis (CT FFR 0.92).   Lexiscan  Myoview  06/2022 showed no ischemia, EF 81%.  Echocardiogram 02/2024 showed normal biventricular function, mild to moderate mitral regurgitation. -Unable to tolerate statins.  On Zetia  10 mg daily.  LDL 138 08/2024.  Referred to pharmacy lipid clinic  Mitral regurgitation: Mild to moderate MR on echo on 04/05/2020.  Mild on echocardiogram 04/2021.  Mild to moderate on echocardiogram 02/2024, will monitor  Aortic sclerosis/stenosis: Echocardiogram 05/16/2021 showed normal biventricular function, mild aortic stenosis, mild mitral regurgitation.  Echocardiogram 02/2024 showed aortic sclerosis but no stenosis  Palpitations: Zio patch x3 days showed short episodes of SVT, longest lasting 19 beats.  She appeared asymptomatic during these episodes.  Zio patch x14 days on 12/13/2020 showed 105 episodes  of SVT, longest lasting 16 seconds.   - Worsening palpitations, suspect may be related to SVT.  Start carvedilol  6.25 mg twice daily.  Recommend Zio patch x 2 weeks  Hyperlipidemia: Lipid panel showed LDL 185 on 11/20/2019.  Started rosuvastatin  10 mg daily on 04/06/2020.  She states that she only took for about a week, was unable to tolerate.  Subsequently tried atorvastatin  10 mg daily but again had leg pain so discontinued.  LDL 112 on 05/15/2020.  Started pravastatin  20 mg daily.  LDL 142 on 07/17/2021.  Did not tolerate pravastatin , switched to Zetia  10 mg daily.  LDL 138 08/2024 - Refer to pharmacy lipid clinic  Hypertension: BP elevated, adding carvedilol  for SVT as above.  Asked to check BP daily for next 2 weeks and let us  know results  RTC in 6 months  Medication Adjustments/Labs and Tests Ordered: Current medicines are reviewed at length with the patient today.  Concerns regarding medicines are outlined above.  Orders Placed This Encounter  Procedures   AMB Referral to [facility]   LONG TERM MONITOR (3-14 DAYS)   EKG 12-Lead   Meds ordered this encounter  Medications   carvedilol   (COREG ) 6.25 MG tablet    Sig: Take 1 tablet (6.25 mg total) by mouth 2 (two) times daily.    Dispense:  180 tablet    Refill:  3    Patient Instructions  Medication Instructions:  Start Coreg  6.25 twice a day Your physician recommends that you continue on your current medications as directed. Please refer to the Current Medication list given to you today.  *If you need a refill on your cardiac medications before your next appointment, please call your pharmacy*  Lab Work: none If you have labs (blood work) drawn today and your tests are completely normal, you will receive your results only by: MyChart Message (if you have MyChart) OR A paper copy in the mail If you have any lab test that is abnormal or we need to change your treatment, we will call you to review the results.  Testing/Procedures: Zio  ZIO XT- Long Term Monitor Instructions  Your physician has requested you wear a ZIO patch monitor for 14 days.  This is a single patch monitor. [employer] supplies one patch monitor per enrollment. Additional stickers are not available. Please do not apply patch if you will be having a Nuclear Stress Test,  Echocardiogram, Cardiac CT, MRI, or Chest Xray during the period you would be wearing the  monitor. The patch cannot be worn during these tests. You cannot remove and re-apply the  ZIO XT patch monitor.  Your ZIO patch monitor will be mailed 3 day USPS to your address on file. It may take 3-5 days  to receive your monitor after you have been enrolled.  Once you have received your monitor, please review the enclosed instructions. Your monitor  has already been registered assigning a specific monitor serial # to you.  Billing and Patient Assistance Program Information  We have supplied [employer] with any of your insurance information on file for billing purposes. [employer] offers a sliding scale Patient Assistance Program for patients that do not have  insurance, or whose insurance does  not completely cover the cost of the ZIO monitor.  You must apply for the Patient Assistance Program to qualify for this discounted rate.  To apply, please call [employer] at [phone], select option 4, select option 2, ask to apply for  Patient Assistance Program. Meredeth will ask  your household income, and how many people  are in your household. They will quote your out-of-pocket cost based on that information.  [employer] will also be able to set up a 31-month, interest-free payment plan if needed.  Applying the monitor   Shave hair from upper left chest.  Hold abrader disc by orange tab. Rub abrader in 40 strokes over the upper left chest as  indicated in your monitor instructions.  Clean area with 4 enclosed alcohol pads. Let dry.  Apply patch as indicated in monitor instructions. Patch will be placed under collarbone on left  side of chest with arrow pointing upward.  Rub patch adhesive wings for 2 minutes. Remove white label marked 1. Remove the white  label marked 2. Rub patch adhesive wings for 2 additional minutes.  While looking in a mirror, press and release button in center of patch. A small green light will  flash 3-4 times. This will be your only indicator that the monitor has been turned on.  Do not shower for the first 24 hours. You may shower after the first 24 hours.  Press the button if you feel a symptom. You will hear a small click. Record Date, Time and  Symptom in the Patient Logbook.  When you are ready to remove the patch, follow instructions on the last 2 pages of Patient  Logbook. Stick patch monitor onto the last page of Patient Logbook.  Place Patient Logbook in the blue and white box. Use locking tab on box and tape box closed  securely. The blue and white box has prepaid postage on it. Please place it in the mailbox as  soon as possible. Your physician should have your test results approximately 7 days after the  monitor has been mailed back to [facility].   Call [facility] Customer Care at [phone] if you have questions regarding  your ZIO XT patch monitor. Call them immediately if you see an orange light blinking on your  monitor.  If your monitor falls off in less than 4 days, contact our Monitor department at [phone].  If your monitor becomes loose or falls off after 4 days call [employer] at [phone] for  suggestions on securing your monitor   Follow-Up: At [facility], you and your health needs are our priority.  As part of our continuing mission to provide you with exceptional heart care, our providers are all part of one team.  This team includes your primary Cardiologist (physician) and Advanced Practice Providers or APPs (Physician Assistants and Nurse Practitioners) who all work together to provide you with the care you need, when you need it.  Your next appointment:   6 months  Provider:   Dr.Rozelia Catapano  We recommend signing up for the patient portal called MyChart.  Sign up information is provided on this After Visit Summary.  MyChart is used to connect with patients for Virtual Visits (Telemedicine).  Patients are able to view lab/test results, encounter notes, upcoming appointments, etc.  Non-urgent messages can be sent to your provider as well.   To learn more about what you can do with MyChart, go to [email or web address].   Other Instructions Please check blood pressure daily  for next 2 weeks and send those reading            Signed, Lonni LITTIE Nanas, MD  11/16/2024 12:24 PM    [facility] "

## 2024-11-16 ENCOUNTER — Ambulatory Visit

## 2024-11-16 ENCOUNTER — Other Ambulatory Visit (HOSPITAL_COMMUNITY): Payer: Self-pay

## 2024-11-16 ENCOUNTER — Ambulatory Visit: Admitting: Cardiology

## 2024-11-16 VITALS — BP 163/75 | HR 73 | Ht 59.0 in | Wt 123.0 lb

## 2024-11-16 DIAGNOSIS — I10 Essential (primary) hypertension: Secondary | ICD-10-CM | POA: Diagnosis not present

## 2024-11-16 DIAGNOSIS — E785 Hyperlipidemia, unspecified: Secondary | ICD-10-CM | POA: Diagnosis not present

## 2024-11-16 DIAGNOSIS — R002 Palpitations: Secondary | ICD-10-CM

## 2024-11-16 DIAGNOSIS — I340 Nonrheumatic mitral (valve) insufficiency: Secondary | ICD-10-CM

## 2024-11-16 DIAGNOSIS — I471 Supraventricular tachycardia, unspecified: Secondary | ICD-10-CM | POA: Diagnosis not present

## 2024-11-16 DIAGNOSIS — I2510 Atherosclerotic heart disease of native coronary artery without angina pectoris: Secondary | ICD-10-CM

## 2024-11-16 MED ORDER — CARVEDILOL 6.25 MG PO TABS
6.2500 mg | ORAL_TABLET | Freq: Two times a day (BID) | ORAL | 3 refills | Status: AC
  Filled 2024-11-16: qty 180, 90d supply, fill #0

## 2024-11-16 NOTE — Patient Instructions (Addendum)
 Medication Instructions:  Start Coreg  6.25 twice a day Your physician recommends that you continue on your current medications as directed. Please refer to the Current Medication list given to you today.  *If you need a refill on your cardiac medications before your next appointment, please call your pharmacy*  Lab Work: none If you have labs (blood work) drawn today and your tests are completely normal, you will receive your results only by: MyChart Message (if you have MyChart) OR A paper copy in the mail If you have any lab test that is abnormal or we need to change your treatment, we will call you to review the results.  Testing/Procedures: Zio  ZIO XT- Long Term Monitor Instructions  Your physician has requested you wear a ZIO patch monitor for 14 days.  This is a single patch monitor. [employer] supplies one patch monitor per enrollment. Additional stickers are not available. Please do not apply patch if you will be having a Nuclear Stress Test,  Echocardiogram, Cardiac CT, MRI, or Chest Xray during the period you would be wearing the  monitor. The patch cannot be worn during these tests. You cannot remove and re-apply the  ZIO XT patch monitor.  Your ZIO patch monitor will be mailed 3 day USPS to your address on file. It may take 3-5 days  to receive your monitor after you have been enrolled.  Once you have received your monitor, please review the enclosed instructions. Your monitor  has already been registered assigning a specific monitor serial # to you.  Billing and Patient Assistance Program Information  We have supplied [employer] with any of your insurance information on file for billing purposes. [employer] offers a sliding scale Patient Assistance Program for patients that do not have  insurance, or whose insurance does not completely cover the cost of the ZIO monitor.  You must apply for the Patient Assistance Program to qualify for this discounted rate.  To apply, please  call [employer] at [phone], select option 4, select option 2, ask to apply for  Patient Assistance Program. Meredeth will ask your household income, and how many people  are in your household. They will quote your out-of-pocket cost based on that information.  [employer] will also be able to set up a 20-month, interest-free payment plan if needed.  Applying the monitor   Shave hair from upper left chest.  Hold abrader disc by orange tab. Rub abrader in 40 strokes over the upper left chest as  indicated in your monitor instructions.  Clean area with 4 enclosed alcohol pads. Let dry.  Apply patch as indicated in monitor instructions. Patch will be placed under collarbone on left  side of chest with arrow pointing upward.  Rub patch adhesive wings for 2 minutes. Remove white label marked 1. Remove the white  label marked 2. Rub patch adhesive wings for 2 additional minutes.  While looking in a mirror, press and release button in center of patch. A small green light will  flash 3-4 times. This will be your only indicator that the monitor has been turned on.  Do not shower for the first 24 hours. You may shower after the first 24 hours.  Press the button if you feel a symptom. You will hear a small click. Record Date, Time and  Symptom in the Patient Logbook.  When you are ready to remove the patch, follow instructions on the last 2 pages of Patient  Logbook. Stick patch monitor onto the last page of Patient  Logbook.  Place Patient Logbook in the blue and white box. Use locking tab on box and tape box closed  securely. The blue and white box has prepaid postage on it. Please place it in the mailbox as  soon as possible. Your physician should have your test results approximately 7 days after the  monitor has been mailed back to [facility].  Call [facility] Customer Care at [phone] if you have questions regarding  your ZIO XT patch monitor. Call them immediately if you see an  orange light blinking on your  monitor.  If your monitor falls off in less than 4 days, contact our Monitor department at [phone].  If your monitor becomes loose or falls off after 4 days call [employer] at [phone] for  suggestions on securing your monitor   Follow-Up: At [facility], you and your health needs are our priority.  As part of our continuing mission to provide you with exceptional heart care, our providers are all part of one team.  This team includes your primary Cardiologist (physician) and Advanced Practice Providers or APPs (Physician Assistants and Nurse Practitioners) who all work together to provide you with the care you need, when you need it.  Your next appointment:   6 months  Provider:   Dr.Schumann  We recommend signing up for the patient portal called MyChart.  Sign up information is provided on this After Visit Summary.  MyChart is used to connect with patients for Virtual Visits (Telemedicine).  Patients are able to view lab/test results, encounter notes, upcoming appointments, etc.  Non-urgent messages can be sent to your provider as well.   To learn more about what you can do with MyChart, go to [email or web address].   Other Instructions Please check blood pressure daily  for next 2 weeks and send those reading

## 2024-11-16 NOTE — Progress Notes (Unsigned)
 Enrolled for [employer] to mail a ZIO XT long term holter monitor to the patients address on file.

## 2024-11-17 ENCOUNTER — Telehealth: Payer: Self-pay | Admitting: Cardiology

## 2024-11-17 NOTE — Telephone Encounter (Signed)
 Pt c/o medication issue:  1. Name of Medication:   carvedilol  (COREG ) 6.25 MG tablet    2. How are you currently taking this medication (dosage and times per day)?    3. Are you having a reaction (difficulty breathing--STAT)? no  4. What is your medication issue? Patient states that medication is causing her some issues, she not sure what exactly. Stated that when she was sleep her family had to call her name several times to wake her up. Please advise

## 2024-11-17 NOTE — Telephone Encounter (Signed)
 Spoke with pt. Pt states she started coreg  this morning and after a little while got very tired, sat down and fell asleep.BP 143/68 this morning 136/65 around lunch time. No BP around the time this happened. Pt states he family could not wake her. She stated they thought they may need to call 911. Pt states this scared her and she has stated she will not take this medication again.  Advised pt to keep track of her BP. Reviewed 911/ED precautions. Advised I would forward to Dr Kate for review and recommendations. Pt stated understanding.

## 2024-11-18 NOTE — Telephone Encounter (Signed)
 Recommend checking BP twice daily for next week and letting us  know results.  Can hold carvedilol  for now

## 2024-11-18 NOTE — Telephone Encounter (Signed)
 Spoke with pt. Recommendations given. Pt stated understanding.

## 2024-11-21 NOTE — Progress Notes (Unsigned)
 Patient ID: Taylor Jacobs                 DOB: 10/27/1935                    MRN: [id number]      HPI: Taylor Jacobs is a 89 y.o. female patient referred to lipid clinic by Dr. Kate. PMH is significant for HLD, prediabetes, and GERD.  Patient was last seen by Dr. Kate last week for .  Patient presents today for PharmD lipid clinic.    Reviewed options for lowering LDL cholesterol, including ezetimibe , PCSK-9 inhibitors, bempedoic acid and inclisiran.  Discussed mechanisms of action, dosing, side effects and potential decreases in LDL cholesterol.  Also reviewed cost information and potential options for patient assistance.   Current Medications: ezetimibe  10 mg daily Intolerances: rosuvastatin  (  ), atorvastatin  (  ), pravastatin  (  ) Risk Factors: age,  LDL goal: < 70 Lipid panel (08/2024):  Chol 233, HDL 67, LDL 138, Trig 164 Liver enzymes:  Diet:  Breakfast: Lunch/Dinner: Snacks: Beverages:  Exercise:   Family History:     Social History:  Alcohol: Smoking:  Labs:  Lipid Panel     Component Value Date/Time   CHOL 194 07/27/2023 1255   TRIG 173 (H) 07/27/2023 1255   HDL 53 07/27/2023 1255   CHOLHDL 3.7 07/27/2023 1255   LDLCALC 111 (H) 07/27/2023 1255   LDLDIRECT 118 (H) 07/27/2023 1255   LABVLDL 30 07/27/2023 1255    Past Medical History:  Diagnosis Date   GERD (gastroesophageal reflux disease)    Headache    Heart murmur     Medications Ordered Prior to Encounter[1]  Allergies[2]  Assessment/Plan:  1. Hyperlipidemia -  No problems updated. No problem-specific Assessment & Plan notes found for this encounter.    Thank you,  Cheo Selvey E. Jerusalem Brownstein, Pharm.D, CPP [place] [facility]. [facility] [address], [place], [place] [address] Phone: [phone]; Fax: [phone]        [1]  Current Outpatient Medications on File Prior to Visit  Medication Sig Dispense Refill   acetaminophen  (TYLENOL) 500 MG tablet Take 1,000 mg by mouth every 6 (six) hours as needed (For headache, pain or fever.).     albuterol  (VENTOLIN  HFA) 108 (90 Base) MCG/ACT inhaler SMARTSIG:2 Puff(s) By Mouth Every 4 Hours PRN     carvedilol  (COREG ) 6.25 MG tablet Take 1 tablet (6.25 mg total) by mouth 2 (two) times daily. 180 tablet 3   dexlansoprazole (DEXILANT) 60 MG capsule Take 60 mg by mouth daily.     ezetimibe  (ZETIA ) 10 MG tablet Take 1 tablet (10 mg total) by mouth daily. 90 tablet 2   guaiFENesin (MUCINEX) 600 MG 12 hr tablet 1 tablet as needed     oxybutynin (DITROPAN) 5 MG tablet Take 5 mg by mouth 2 (two) times daily. (Patient taking differently: Take 5 mg by mouth as needed.)     sucralfate  (CARAFATE ) 1 g tablet Take 1 tablet (1 g total) by mouth 4 (four) times daily -  with meals and at bedtime. 120 tablet 1   No current facility-administered medications on file prior to visit.  [2]  Allergies Allergen Reactions   Atorvastatin  Other (See Comments)    Leg pain   Betadine [Povidone Iodine] Itching   Sulfa Antibiotics Other (See Comments)    Reaction unknown

## 2024-11-22 ENCOUNTER — Ambulatory Visit

## 2024-11-25 ENCOUNTER — Telehealth: Payer: Self-pay | Admitting: Cardiology

## 2024-11-25 NOTE — Telephone Encounter (Signed)
 Pt calling with BP readings   1/27- 128/62 113/60    1/28- 137/62  125/62 127/68  1/30- 111/61

## 2024-11-25 NOTE — Telephone Encounter (Signed)
 Spoke with pt regarding her blood pressure readings. She is taking them in the morning after breakfast and morning medications. Pt still continues to hold carvedilol . Pt will plan to continue keeping blood pressure log for another 1.5 weeks.   Pt does mention that zio monitor that she is currently wearing is getting a little itchy. She states that it is not bad but causes a bit of discomfort. Zio monitor was ordered for 14 days, today is day 6.   Pt verbalizes understanding. Pt is aware that call back will likely be Monday or Tuesday of next week.

## 2024-11-28 NOTE — Telephone Encounter (Signed)
 BP looks good.  She can only wear ZIO for 7 days that is okay

## 2024-11-28 NOTE — Telephone Encounter (Signed)
 Left message informing that pt could take off monitor and send it back now. Instructed pt to call with any questions or concerns.

## 2025-01-09 ENCOUNTER — Ambulatory Visit: Admitting: Pharmacist Clinician (PhC)/ Clinical Pharmacy Specialist
